# Patient Record
Sex: Female | Born: 1985 | Race: White | Hispanic: Yes | Marital: Married | Smoking: Never smoker
Health system: Southern US, Community
[De-identification: ages and names within clinical notes are randomized; demographics above are authoritative.]

## PROBLEM LIST (undated history)

## (undated) ENCOUNTER — Inpatient Hospital Stay (HOSPITAL_COMMUNITY): Payer: Self-pay

## (undated) DIAGNOSIS — R87629 Unspecified abnormal cytological findings in specimens from vagina: Secondary | ICD-10-CM

## (undated) DIAGNOSIS — F3181 Bipolar II disorder: Secondary | ICD-10-CM

## (undated) DIAGNOSIS — R519 Headache, unspecified: Secondary | ICD-10-CM

## (undated) DIAGNOSIS — D649 Anemia, unspecified: Secondary | ICD-10-CM

## (undated) DIAGNOSIS — F419 Anxiety disorder, unspecified: Secondary | ICD-10-CM

## (undated) DIAGNOSIS — G4700 Insomnia, unspecified: Secondary | ICD-10-CM

## (undated) DIAGNOSIS — F4310 Post-traumatic stress disorder, unspecified: Secondary | ICD-10-CM

## (undated) DIAGNOSIS — F319 Bipolar disorder, unspecified: Secondary | ICD-10-CM

## (undated) DIAGNOSIS — F329 Major depressive disorder, single episode, unspecified: Secondary | ICD-10-CM

## (undated) DIAGNOSIS — F32A Depression, unspecified: Secondary | ICD-10-CM

## (undated) HISTORY — DX: Bipolar II disorder: F31.81

## (undated) HISTORY — DX: Insomnia, unspecified: G47.00

## (undated) HISTORY — DX: Unspecified abnormal cytological findings in specimens from vagina: R87.629

## (undated) HISTORY — PX: NO PAST SURGERIES: SHX2092

## (undated) HISTORY — DX: Anemia, unspecified: D64.9

## (undated) HISTORY — DX: Post-traumatic stress disorder, unspecified: F43.10

## (undated) HISTORY — DX: Headache, unspecified: R51.9

## (undated) HISTORY — DX: Depression, unspecified: F32.A

## (undated) HISTORY — DX: Anxiety disorder, unspecified: F41.9

---
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
## 2013-08-10 ENCOUNTER — Ambulatory Visit: Payer: BC Managed Care – PPO | Admitting: Obstetrics & Gynecology

## 2013-12-17 ENCOUNTER — Telehealth: Payer: Self-pay

## 2013-12-17 ENCOUNTER — Encounter: Payer: Self-pay | Admitting: Obstetrics & Gynecology

## 2013-12-17 ENCOUNTER — Ambulatory Visit (INDEPENDENT_AMBULATORY_CARE_PROVIDER_SITE_OTHER): Payer: BC Managed Care – PPO | Admitting: Obstetrics & Gynecology

## 2013-12-17 VITALS — BP 134/86 | HR 84 | Temp 98.4°F | Ht 65.0 in | Wt 149.0 lb

## 2013-12-17 DIAGNOSIS — N939 Abnormal uterine and vaginal bleeding, unspecified: Secondary | ICD-10-CM

## 2013-12-17 DIAGNOSIS — L680 Hirsutism: Secondary | ICD-10-CM

## 2013-12-17 DIAGNOSIS — Z01419 Encounter for gynecological examination (general) (routine) without abnormal findings: Secondary | ICD-10-CM

## 2013-12-17 LAB — POCT URINE PREGNANCY: Preg Test, Ur: NEGATIVE

## 2013-12-17 NOTE — Telephone Encounter (Signed)
patient will call us to schedule ultrasound - she is checking with her insurance first to see what coverage would be

## 2013-12-17 NOTE — Patient Instructions (Signed)
Hirsutism and Masculinization  Hirsutism (increased body hair) is the growth of colored (pigmented) thick hair in women. It is most noticeable when it is on the moustache or beard areas. The other common sites are the:  · Chest.  · Abdomen.  · Thighs.  · Back.  Pubic hair growth may run upward from the usual bikini line to the middle of the abdomen.   Virilism (masculinization) is more extensive than hirsutism. It has extra symptoms. There may be:  · Acne.  · Oily skin.  · Baldness.  · Enlargement of the clitoris.  · Increased sex drive (libido).  · Voice deepening.  · Reduced breast size.  · Irregular or absent periods.  · Aggression.  The scalp hair growth may also bald in a typical female pattern.  CAUSES   This is caused by too much female sex hormone (androgen) in the body. It can be produced by the ovaries, adrenal glands, and within the skin. Hirsutism is most commonly related to polycystic ovarian syndrome (PCOS). The first signs of increased androgen levels are hirsutism and acne. How sensitive each person is to hormone levels varies greatly. Virilism may result from higher androgen levels. Some women with hirsutism have normal hormone levels. Eventually there may be female pattern balding. These problems are also connected to difficulty in having children (infertility). In addition, both malignant and benign tumors may cause hirsutism such as tumors of the adrenal gland (adenomas or adenocarcinomas) but this is a rare cause.  There is also evidence that insulin resistance may cause the androgynism. This problem is treated with some success with a medicine for diabetes (metformin).  Causes that come from outside the body (exogenous) may also lead to hirsutism such as intake of androgens by mouth.   Note: Women of Southwest Asian, Eastern European, and Southern European heritage commonly have facial, abdominal, and thigh hair that is normal for them.   TREATMENT   There are medical treatments that inhibit these  conditions, such as:  · Combined oral contraceptive pills, if you are not trying to become pregnant.  · Medicines that stop the production of hormones (gonadotropins).  · Steroids. This may be used if there is evidence of congenital (present since birth) adrenal hyperplasia (abnormal growth of cells).  · Metformin for the treatment of virilization, if sensitive to insulin.  · Suppression of ovarian hormone production with GnRH analogues (a hormone). They can only be used by themselves for short periods of time.  There are a variety of cosmetic treatments. These may be all that you need. They may be effective against occasional problems.  · Shaving is the simplest and most effective in the short term. Bleaching is not usually suitable for severe hirsutism.  · Plucking, waxing, sugaring (similar to waxing), and depilatory creams are effective. However, on occasion, they can result in skin irritation (inflammation) or infection.  · Electrolysis is effective.  Your caregiver can help you decide what needs to be done and what course of treatment will be best for you. Your caregiver may refer you to an endocrinologist. This is a physician who is specialized in the treatment of glandular disorders.  Document Released: 03/26/2001 Document Revised: 06/01/2013 Document Reviewed: 05/12/2008  ExitCare® Patient Information ©2015 [employer]. This information is not intended to replace advice given to you by your health care provider. Make sure you discuss any questions you have with your health care provider.

## 2013-12-17 NOTE — Progress Notes (Signed)
Subjective:     Jillian Holden is a 28 y.o. female here for a routine exam.     Personal health questionnaire:  Is patient Ashkenazi Jewish, have a family history of breast and/or ovarian cancer: no Is there a family history of uterine cancer diagnosed at age < 3650, gastrointestinal cancer, urinary tract cancer, family member who is a Personnel officerLynch syndrome-associated carrier: no Is the patient overweight and hypertensive, family history of diabetes, personal history of gestational diabetes or PCOS: no Is patient over 1355, have PCOS,  family history of premature CHD under age 26, diabetes, smoke, have hypertension or peripheral artery disease:  no At any time, has a partner hit, kicked or otherwise hurt or frightened you?: no Over the past 2 weeks, have you felt down, depressed or hopeless?: no Over the past 2 weeks, have you felt little interest or pleasure in doing things?:no   Gynecologic History Patient's last menstrual period was 12/06/2013. Contraception: condoms Last Pap: 3 yrs ago. Results were: normal   Obstetric History OB History  Gravida Para Term Preterm AB SAB TAB Ectopic Multiple Living  1    1 1         # Outcome Date GA Lbr Len/2nd Weight Sex Delivery Anes PTL Lv  1 SAB 2009     SAB         Past Medical History  Diagnosis Date  . Bipolar 2 disorder   . Insomnia   . Anxiety   . PTSD (post-traumatic stress disorder)     History reviewed. No pertinent past surgical history.  Current outpatient prescriptions: hydrOXYzine (VISTARIL) 25 MG capsule, Take 25 mg by mouth 3 (three) times daily as needed., Disp: , Rfl: ;  traZODone (DESYREL) 50 MG tablet, Take 50 mg by mouth at bedtime., Disp: , Rfl:  Allergies  Allergen Reactions  . Ambien [Zolpidem Tartrate] Other (See Comments)    Hallucination     History  Substance Use Topics  . Smoking status: Never Smoker   . Smokeless tobacco: Never Used  . Alcohol Use: 0.0 oz/week    0 Not specified per week     Comment:  Socially    History reviewed. No pertinent family history.    Review of Systems  Constitutional: negative for fatigue and weight loss Respiratory: negative for cough and wheezing Cardiovascular: negative for chest pain, fatigue and palpitations Gastrointestinal: negative for abdominal pain and change in bowel habits Musculoskeletal:negative for myalgias Neurological: negative for gait problems and tremors Behavioral/Psych: negative for abusive relationship, depression Endocrine: negative for temperature intolerance   Genitourinary: positive for abnormal menstrual periods; negative for genital lesions, hot flashes, sexual problems and vaginal discharge Integument/breast: negative for breast lump, breast tenderness, nipple discharge and positive for unwanted body hair    Objective:       BP 134/86 mmHg  Pulse 84  Temp(Src) 98.4 F (36.9 C)  Ht 5\' 5"  (1.651 m)  Wt 67.586 kg (149 lb)  BMI 24.79 kg/m2  LMP 12/06/2013 General:   alert  Skin:   no rash; hair on chest; female escutcheon, hair on buttocks  Lungs:   clear to auscultation bilaterally  Heart:   regular rate and rhythm, S1, S2 normal, no murmur, click, rub or gallop  Breasts:   normal without suspicious masses, skin or nipple changes or axillary nodes  Abdomen:  normal findings: no organomegaly, soft, non-tender and no hernia  Pelvis:  External genitalia: normal general appearance Urinary system: urethral meatus normal and bladder without fullness, nontender  Vaginal: normal without tenderness, induration or masses Cervix: normal appearance Adnexa: normal bimanual exam Uterus: anteverted and non-tender, normal size   Lab Review Urine pregnancy test Labs reviewed no Radiologic studies reviewed no    Assessment:    Healthy female exam.   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     Orders Placed This Encounter  Procedures  . US Pelvis Complete    Standing Status: Future     Number of Occurrences:      Standing Expiration Date: 02/17/2015    Order Specific Question:  Reason for Exam (SYMPTOM  OR DIAGNOSIS REQUIRED)    Answer:  Irregular cycles.    Order Specific Question:  Preferred imaging location?    Answer:  Internal  . TSH  . Prolactin  . Testosterone, free, total  . 17-Hydroxyprogesterone  . Progesterone  . DHEA-sulfate  . POCT urine pregnancy    Possible management options include: Skyla IUD Follow up as needed.

## 2013-12-18 ENCOUNTER — Encounter: Payer: Self-pay | Admitting: Obstetrics & Gynecology

## 2013-12-18 LAB — PAP IG W/ RFLX HPV ASCU

## 2013-12-18 LAB — TESTOSTERONE, FREE, TOTAL, SHBG
SEX HORMONE BINDING: 29 nmol/L (ref 18–114)
Testosterone, Free: 11.3 pg/mL — ABNORMAL HIGH (ref 0.6–6.8)
Testosterone-% Free: 1.9 % (ref 0.4–2.4)
Testosterone: 58 ng/dL (ref 10–70)

## 2013-12-18 LAB — PROLACTIN: Prolactin: 15.2 ng/mL

## 2013-12-18 LAB — DHEA-SULFATE: DHEA-SO4: 342 ug/dL (ref 18–391)

## 2013-12-18 LAB — TSH: TSH: 1.383 u[IU]/mL (ref 0.350–4.500)

## 2013-12-18 LAB — PROGESTERONE: PROGESTERONE: 0.7 ng/mL

## 2013-12-23 LAB — 17-HYDROXYPROGESTERONE: 17-OH-Progesterone, LC/MS/MS: 76 ng/dL

## 2014-01-25 ENCOUNTER — Encounter: Payer: Self-pay | Admitting: *Deleted

## 2014-01-26 ENCOUNTER — Encounter: Payer: Self-pay | Admitting: Obstetrics & Gynecology

## 2014-12-10 ENCOUNTER — Other Ambulatory Visit (INDEPENDENT_AMBULATORY_CARE_PROVIDER_SITE_OTHER): Payer: BLUE CROSS/BLUE SHIELD

## 2014-12-10 VITALS — BP 123/82 | HR 81 | Wt 145.0 lb

## 2014-12-10 DIAGNOSIS — Z3200 Encounter for pregnancy test, result unknown: Secondary | ICD-10-CM

## 2014-12-10 DIAGNOSIS — Z3201 Encounter for pregnancy test, result positive: Secondary | ICD-10-CM | POA: Diagnosis not present

## 2014-12-10 LAB — POCT URINE PREGNANCY: Preg Test, Ur: POSITIVE — AB

## 2014-12-10 NOTE — Progress Notes (Unsigned)
Pt is in office today for UPT.  Pt states that she has had Positive at home test.  UPT in office today is positive. Pt states that LMP 11-09-14.  Dating her at 4weeks 3days.  Pt was made aware of needs to seek emergent care at hospital as needed.  Pt advised that she may call office as needed for any question/concerns.  Pt states that she is currently taking a PNV. Pt is also having some nausea.   Pt was advised to stop taking PNV for a few days to see if that will help her nausea.    Pt has been made aware of scheduling timeframe and appt policy.  Pt is to be scheduled for NOB at check out today.

## 2015-01-18 ENCOUNTER — Encounter: Payer: Self-pay | Admitting: Certified Nurse Midwife

## 2015-01-18 ENCOUNTER — Ambulatory Visit (INDEPENDENT_AMBULATORY_CARE_PROVIDER_SITE_OTHER): Payer: BLUE CROSS/BLUE SHIELD | Admitting: Certified Nurse Midwife

## 2015-01-18 ENCOUNTER — Other Ambulatory Visit: Payer: Self-pay | Admitting: Certified Nurse Midwife

## 2015-01-18 VITALS — BP 126/77 | HR 87 | Wt 140.0 lb

## 2015-01-18 DIAGNOSIS — O219 Vomiting of pregnancy, unspecified: Secondary | ICD-10-CM | POA: Diagnosis not present

## 2015-01-18 DIAGNOSIS — Z3401 Encounter for supervision of normal first pregnancy, first trimester: Secondary | ICD-10-CM | POA: Insufficient documentation

## 2015-01-18 MED ORDER — PROMETHAZINE HCL 25 MG RE SUPP
25.0000 mg | Freq: Four times a day (QID) | RECTAL | Status: DC | PRN
Start: 2015-01-18 — End: 2015-02-15

## 2015-01-18 MED ORDER — METOCLOPRAMIDE HCL 10 MG PO TABS: 10.0000 mg | ORAL_TABLET | Freq: Three times a day (TID) | ORAL | Status: DC

## 2015-01-18 MED ORDER — PROMETHAZINE HCL 25 MG PO TABS: 25.0000 mg | ORAL_TABLET | Freq: Four times a day (QID) | ORAL | Status: DC | PRN

## 2015-01-18 NOTE — Progress Notes (Signed)
Pt is having N&V, not much appetite.

## 2015-01-18 NOTE — Progress Notes (Signed)
Subjective:    Jillian Holden is being seen today for her first obstetrical visit.  This is a planned pregnancy. She is at 799w0d gestation. Her obstetrical history is significant for none.  Not on medications for depression or bipolar disorder.  Relationship with FOB: spouse, living together. Patient does intend to breast feed. Pregnancy history fully reviewed.  Works full-time as an Print production planneroffice manager.    The information documented in the HPI was reviewed and verified.  Menstrual History: OB History    Gravida Para Term Preterm AB TAB SAB Ectopic Multiple Living   1    0  0         Menarche age: 29 years of age  Patient's last menstrual period was 11/09/2014.    Past Medical History  Diagnosis Date  . Bipolar 2 disorder (HCC)   . Insomnia   . Anxiety   . PTSD (post-traumatic stress disorder)     History reviewed. No pertinent past surgical history.   (Not in a hospital admission) Allergies  Allergen Reactions  . Ambien [Zolpidem Tartrate] Other (See Comments)    Hallucination     Social History  Substance Use Topics  . Smoking status: Never Smoker   . Smokeless tobacco: Never Used  . Alcohol Use: No     Comment: Socially    History reviewed. No pertinent family history.   Review of Systems Constitutional: negative for weight loss Gastrointestinal: + nausea and vomiting Genitourinary:negative for genital lesions and vaginal discharge and dysuria Musculoskeletal:negative for back pain Behavioral/Psych: negative for abusive relationship, depression, illegal drug usage and tobacco use    Objective:    BP 126/77 mmHg  Pulse 87  Wt 140 lb (63.504 kg)  LMP 11/09/2014 General Appearance:    Alert, cooperative, no distress, appears stated age  Head:    Normocephalic, without obvious abnormality, atraumatic  Eyes:    PERRL, conjunctiva/corneas clear, EOM's intact, fundi    benign, both eyes  Ears:    Normal TM's and external ear canals, both ears  Nose:   Nares normal,  septum midline, mucosa normal, no drainage    or sinus tenderness  Throat:   Lips, mucosa, and tongue normal; teeth and gums normal  Neck:   Supple, symmetrical, trachea midline, no adenopathy;    thyroid:  no enlargement/tenderness/nodules; no carotid   bruit or JVD  Back:     Symmetric, no curvature, ROM normal, no CVA tenderness  Lungs:     Clear to auscultation bilaterally, respirations unlabored  Chest Wall:    No tenderness or deformity   Heart:    Regular rate and rhythm, S1 and S2 normal, no murmur, rub   or gallop  Breast Exam:    No tenderness, masses, or nipple abnormality  Abdomen:     Soft, non-tender, bowel sounds active all four quadrants,    no masses, no organomegaly  Genitalia:    Normal female without lesion, discharge or tenderness  Extremities:   Extremities normal, atraumatic, no cyanosis or edema  Pulses:   2+ and symmetric all extremities  Skin:   Skin color, texture, turgor normal, no rashes or lesions  Lymph nodes:   Cervical, supraclavicular, and axillary nodes normal  Neurologic:   CNII-XII intact, normal strength, sensation and reflexes    throughout            Cervix:  Long, thick, closed and posterior  FHR: 160's, by doppler  Lab Review Urine pregnancy test Labs reviewed yes Radiologic studies reviewed no Assessment:    Pregnancy at [redacted]w[redacted]d weeks   N&V in early pregnancy  Hx of migraines  Hx of GERD  Plan:      Prenatal vitamins.  Counseling provided regarding continued use of seat belts, cessation of alcohol consumption, smoking or use of illicit drugs; infection precautions i.e., influenza/TDAP immunizations, toxoplasmosis,CMV, parvovirus, listeria and varicella; workplace safety, exercise during pregnancy; routine dental care, safe medications, sexual activity, hot tubs, saunas, pools, travel, caffeine use, fish and methlymercury, potential toxins, hair treatments, varicose veins Weight gain recommendations per  IOM guidelines reviewed: underweight/BMI< 18.5--> gain 28 - 40 lbs; normal weight/BMI 18.5 - 24.9--> gain 25 - 35 lbs; overweight/BMI 25 - 29.9--> gain 15 - 25 lbs; obese/BMI >30->gain  11 - 20 lbs Problem list reviewed and updated. FIRST/CF mutation testing/NIPT/QUAD SCREEN/fragile X/Ashkenazi Jewish population testing/Spinal muscular atrophy discussed: requested. Role of ultrasound in pregnancy discussed; fetal survey: requested. Amniocentesis discussed: not indicated. VBAC calculator score: VBAC consent form provided Meds ordered this encounter  Medications  . promethazine (PHENERGAN) 25 MG suppository    Sig: Place 1 suppository (25 mg total) rectally every 6 (six) hours as needed for nausea or vomiting.    Dispense:  12 each    Refill:  0  . promethazine (PHENERGAN) 25 MG tablet    Sig: Take 1 tablet (25 mg total) by mouth every 6 (six) hours as needed for nausea or vomiting.    Dispense:  30 tablet    Refill:  0  . metoCLOPramide (REGLAN) 10 MG tablet    Sig: Take 1 tablet (10 mg total) by mouth 4 (four) times daily -  before meals and at bedtime.    Dispense:  120 tablet    Refill:  5   Orders Placed This Encounter  Procedures  . Culture, OB Urine  . SureSwab, Vaginosis/Vaginitis Plus  . US OB Comp Less 14 Wks    Standing Status: Future     Number of Occurrences:      Standing Expiration Date: 03/20/2016    Order Specific Question:  Reason for Exam (SYMPTOM  OR DIAGNOSIS REQUIRED)    Answer:  dating and viability    Order Specific Question:  Preferred imaging location?    Answer:  Internal  . Obstetric panel  . HIV antibody  . Hemoglobinopathy evaluation  . Varicella zoster antibody, IgG  . VITAMIN D 25 Hydroxy (Vit-D Deficiency, Fractures)  . TSH  . POCT urinalysis dipstick    Follow up in 4 weeks. 50% of 30 min visit spent on counseling and coordination of care.

## 2015-01-19 LAB — VARICELLA ZOSTER ANTIBODY, IGG: VARICELLA IGG: 559.8 {index} — AB (ref <135.00)

## 2015-01-19 LAB — OBSTETRIC PANEL
ANTIBODY SCREEN: NEGATIVE
BASOS PCT: 0 % (ref 0–1)
Basophils Absolute: 0 10*3/uL (ref 0.0–0.1)
EOS ABS: 0.3 10*3/uL (ref 0.0–0.7)
EOS PCT: 3 % (ref 0–5)
HCT: 36.4 % (ref 36.0–46.0)
Hemoglobin: 12.8 g/dL (ref 12.0–15.0)
Hepatitis B Surface Ag: NEGATIVE
LYMPHS PCT: 20 % (ref 12–46)
Lymphs Abs: 1.7 10*3/uL (ref 0.7–4.0)
MCH: 32.2 pg (ref 26.0–34.0)
MCHC: 35.2 g/dL (ref 30.0–36.0)
MCV: 91.7 fL (ref 78.0–100.0)
MONO ABS: 0.7 10*3/uL (ref 0.1–1.0)
MPV: 11.2 fL (ref 8.6–12.4)
Monocytes Relative: 8 % (ref 3–12)
Neutro Abs: 6 10*3/uL (ref 1.7–7.7)
Neutrophils Relative %: 69 % (ref 43–77)
PLATELETS: 192 10*3/uL (ref 150–400)
RBC: 3.97 MIL/uL (ref 3.87–5.11)
RDW: 13.3 % (ref 11.5–15.5)
RH TYPE: POSITIVE
Rubella: 2.11 Index — ABNORMAL HIGH (ref <0.90)
WBC: 8.7 10*3/uL (ref 4.0–10.5)

## 2015-01-19 LAB — VITAMIN D 25 HYDROXY (VIT D DEFICIENCY, FRACTURES): Vit D, 25-Hydroxy: 26 ng/mL — ABNORMAL LOW (ref 30–100)

## 2015-01-19 LAB — CULTURE, OB URINE

## 2015-01-19 LAB — HIV ANTIBODY (ROUTINE TESTING W REFLEX): HIV 1&2 Ab, 4th Generation: NONREACTIVE

## 2015-01-19 LAB — TSH: TSH: 0.555 u[IU]/mL (ref 0.350–4.500)

## 2015-01-20 ENCOUNTER — Other Ambulatory Visit: Payer: Self-pay | Admitting: Certified Nurse Midwife

## 2015-01-20 LAB — PAP IG W/ RFLX HPV ASCU

## 2015-01-20 LAB — HEMOGLOBINOPATHY EVALUATION
HGB A2 QUANT: 2.7 % (ref 2.2–3.2)
HGB F QUANT: 0 % (ref 0.0–2.0)
Hemoglobin Other: 0 %
Hgb A: 97.3 % (ref 96.8–97.8)
Hgb S Quant: 0 %

## 2015-01-22 LAB — SURESWAB, VAGINOSIS/VAGINITIS PLUS
ATOPOBIUM VAGINAE: NOT DETECTED Log (cells/mL)
BV CATEGORY: UNDETERMINED — AB
C. ALBICANS, DNA: NOT DETECTED
C. PARAPSILOSIS, DNA: NOT DETECTED
C. TRACHOMATIS RNA, TMA: NOT DETECTED
C. glabrata, DNA: NOT DETECTED
C. tropicalis, DNA: NOT DETECTED
Gardnerella vaginalis: 6.2 Log (cells/mL)
LACTOBACILLUS SPECIES: 7.8 Log (cells/mL)
MEGASPHAERA SPECIES: NOT DETECTED Log (cells/mL)
N. GONORRHOEAE RNA, TMA: NOT DETECTED
T. vaginalis RNA, QL TMA: NOT DETECTED

## 2015-01-26 ENCOUNTER — Other Ambulatory Visit: Payer: Self-pay | Admitting: Certified Nurse Midwife

## 2015-01-26 DIAGNOSIS — B9689 Other specified bacterial agents as the cause of diseases classified elsewhere: Secondary | ICD-10-CM

## 2015-01-26 DIAGNOSIS — N760 Acute vaginitis: Principal | ICD-10-CM

## 2015-01-26 MED ORDER — METRONIDAZOLE 0.75 % VA GEL: 1.0000 | Freq: Two times a day (BID) | VAGINAL | Status: DC

## 2015-01-27 ENCOUNTER — Other Ambulatory Visit: Payer: Self-pay | Admitting: *Deleted

## 2015-01-27 ENCOUNTER — Other Ambulatory Visit: Payer: Self-pay | Admitting: Certified Nurse Midwife

## 2015-01-27 DIAGNOSIS — O219 Vomiting of pregnancy, unspecified: Secondary | ICD-10-CM

## 2015-01-27 DIAGNOSIS — N760 Acute vaginitis: Principal | ICD-10-CM

## 2015-01-27 DIAGNOSIS — B9689 Other specified bacterial agents as the cause of diseases classified elsewhere: Secondary | ICD-10-CM

## 2015-01-27 LAB — HUMAN PAPILLOMAVIRUS, HIGH RISK: HPV DNA HIGH RISK: DETECTED — AB

## 2015-01-27 MED ORDER — METRONIDAZOLE 0.75 % VA GEL: 1.0000 | Freq: Two times a day (BID) | VAGINAL | Status: DC

## 2015-01-27 MED ORDER — PROMETHAZINE HCL 25 MG PO TABS
25.0000 mg | ORAL_TABLET | Freq: Four times a day (QID) | ORAL | Status: DC | PRN
Start: 2015-01-27 — End: 2015-05-12

## 2015-01-27 MED ORDER — TRAZODONE HCL 50 MG PO TABS: 50.0000 mg | ORAL_TABLET | Freq: Every day | ORAL | Status: DC

## 2015-01-30 LAB — HPV TYPE 16/18
HPV Genotype, 16: NOT DETECTED
HPV Genotype, 18: NOT DETECTED

## 2015-01-30 NOTE — L&D Delivery Note (Signed)
Delivery Note This is a 30 year old G 1 P0 who was admitted for Early latent labor.. She progressed normally with Nitrous oxide and 4U of pitocin to the second stage of labor.  She pushed for 45 min.  At 12:47 AM she delivered a viable infant female, cephalic.  A nuchal cord   was identified with a body cord, somersaulted through. Infant placed on maternal abdomen.  Delayed cord clamping was performed for 7-10 minutes.  Cord double clamped and cut.  Apgar scores were 8 and 9. The placenta delivered spontaneously, shultz, with a 3 vessel cord.  Inspection revealed 2nd degree. The uterus was firm bleeding stable.  The repair was done under local lidocaine injection.   EBL was 400.    Placenta and umbilical artery blood gas were not sent.  There were no complications during the procedure.  Mom and baby skin to skin following delivery. Left in stable condition.   a viable female was delivered via Vaginal, Spontaneous Delivery (Presentation: Left Occiput Anterior).  APGAR: 8, 9; weight  .   Placenta status: Intact, Spontaneous.  Cord: 3 vessels with the following complications: None.  Cord pH: N/A  Anesthesia: Other  Episiotomy: None Lacerations: 2nd degree Suture Repair: 3.0 vicryl Est. Blood Loss (mL): 400  Mom to postpartum.  Baby to Couplet care / Skin to Skin.  Roe Coombsachelle A Journey [place], CNM 08/11/2015, 1:36 AM

## 2015-02-03 ENCOUNTER — Ambulatory Visit (INDEPENDENT_AMBULATORY_CARE_PROVIDER_SITE_OTHER): Payer: BLUE CROSS/BLUE SHIELD

## 2015-02-03 DIAGNOSIS — O3680X1 Pregnancy with inconclusive fetal viability, fetus 1: Secondary | ICD-10-CM | POA: Diagnosis not present

## 2015-02-03 DIAGNOSIS — Z3401 Encounter for supervision of normal first pregnancy, first trimester: Secondary | ICD-10-CM

## 2015-02-15 ENCOUNTER — Ambulatory Visit (INDEPENDENT_AMBULATORY_CARE_PROVIDER_SITE_OTHER): Payer: BLUE CROSS/BLUE SHIELD | Admitting: Certified Nurse Midwife

## 2015-02-15 VITALS — BP 109/69 | HR 87 | Temp 98.2°F | Wt 148.0 lb

## 2015-02-15 DIAGNOSIS — Z3402 Encounter for supervision of normal first pregnancy, second trimester: Secondary | ICD-10-CM

## 2015-02-15 DIAGNOSIS — J069 Acute upper respiratory infection, unspecified: Secondary | ICD-10-CM

## 2015-02-15 DIAGNOSIS — O219 Vomiting of pregnancy, unspecified: Secondary | ICD-10-CM

## 2015-02-15 LAB — POCT URINALYSIS DIPSTICK
BILIRUBIN UA: NEGATIVE
Glucose, UA: NEGATIVE
Ketones, UA: NEGATIVE
Leukocytes, UA: NEGATIVE
NITRITE UA: NEGATIVE
PH UA: 7
Protein, UA: NEGATIVE
RBC UA: NEGATIVE
Spec Grav, UA: <=1.005
UROBILINOGEN UA: NEGATIVE

## 2015-02-15 MED ORDER — GUAIFENESIN ER 600 MG PO TB12: 1200.0000 mg | ORAL_TABLET | Freq: Two times a day (BID) | ORAL | Status: DC

## 2015-02-15 MED ORDER — PSEUDOEPHEDRINE HCL ER 120 MG PO TB12
120.0000 mg | ORAL_TABLET | Freq: Two times a day (BID) | ORAL | Status: DC
Start: 2015-02-15 — End: 2015-05-12

## 2015-02-15 MED ORDER — ONDANSETRON HCL 4 MG PO TABS: 4.0000 mg | ORAL_TABLET | Freq: Every day | ORAL | Status: DC | PRN

## 2015-02-15 MED ORDER — DIPHENHYDRAMINE HCL 25 MG PO TABS: 25.0000 mg | ORAL_TABLET | ORAL | Status: DC | PRN

## 2015-02-15 MED ORDER — FLUTICASONE PROPIONATE 50 MCG/ACT NA SUSP: 2.0000 | Freq: Every day | NASAL | Status: DC

## 2015-02-15 NOTE — Progress Notes (Signed)
  Subjective:    Jillian Holden is a 30 y.o. female being seen today for her obstetrical visit. She is at [redacted]w[redacted]d gestation. Patient reports: fatigue, headache, nausea, no bleeding, no contractions, no cramping, no leaking, vomiting and URI symptoms, denies fever and has not tried anything for the nasal drainage, denies coughing.  Problem List Items Addressed This Visit    None    Visit Diagnoses    Encounter for supervision of normal first pregnancy in second trimester    -  Primary    Relevant Medications    ondansetron (ZOFRAN) 4 MG tablet    fluticasone (FLONASE) 50 MCG/ACT nasal spray    diphenhydrAMINE (BENADRYL) 25 MG tablet    guaiFENesin (MUCINEX) 600 MG 12 hr tablet    pseudoephedrine (SUDAFED 12 HOUR) 120 MG 12 hr tablet    Other Relevant Orders    POCT urinalysis dipstick (Completed)    US OB Comp + 14 Wk    AFP, Quad Screen    Nausea and vomiting in pregnancy prior to [redacted] weeks gestation        Relevant Medications    ondansetron (ZOFRAN) 4 MG tablet    fluticasone (FLONASE) 50 MCG/ACT nasal spray    diphenhydrAMINE (BENADRYL) 25 MG tablet    guaiFENesin (MUCINEX) 600 MG 12 hr tablet    pseudoephedrine (SUDAFED 12 HOUR) 120 MG 12 hr tablet    URI, acute        Relevant Medications    fluticasone (FLONASE) 50 MCG/ACT nasal spray    diphenhydrAMINE (BENADRYL) 25 MG tablet    guaiFENesin (MUCINEX) 600 MG 12 hr tablet    pseudoephedrine (SUDAFED 12 HOUR) 120 MG 12 hr tablet      Patient Active Problem List   Diagnosis Date Noted  . Supervision of normal first pregnancy in first trimester 01/18/2015    Objective:     BP 109/69 mmHg  Pulse 87  Temp(Src) 98.2 F (36.8 C)  Wt 148 lb (67.132 kg)  LMP 11/09/2014 Uterine Size: Below umbilicus   FHR: seen with bedside US  HEENT:  Clear nasal drainage present, bilateral septum erythema, sinuses: non-tender  Assessment:    Pregnancy @ [redacted]w[redacted]d  weeks URI acute    N&V in early pregnancy Plan:    Problem list  reviewed and updated. Labs reviewed.  Follow up in 4 weeks. FIRST/CF mutation testing/NIPT/QUAD SCREEN/fragile X/Ashkenazi Jewish population testing/Spinal muscular atrophy discussed: ordered. Role of ultrasound in pregnancy discussed; fetal survey: ordered. Amniocentesis discussed: not indicated. 50% of 15 minute visit spent on counseling and coordination of care.

## 2015-02-17 LAB — AFP, QUAD SCREEN
AFP: 12.9 ng/mL
Age Alone: 1:874 {titer}
Curr Gest Age: 14 wks.days
Down Syndrome Scr Risk Est: 1:138 {titer}
HCG, Total: 89.83 IU/mL
INH: 328.9 pg/mL
Interpretation-AFP: POSITIVE — AB
MOM FOR HCG: 1.27
MoM for AFP: 0.53
MoM for INH: 1.61
OPEN SPINA BIFIDA: NEGATIVE
TRI 18 SCR RISK EST: NEGATIVE
UE3 VALUE: 0.39 ng/mL
uE3 Mom: 0.85

## 2015-02-18 ENCOUNTER — Telehealth: Payer: Self-pay

## 2015-02-18 ENCOUNTER — Other Ambulatory Visit: Payer: Self-pay | Admitting: Certified Nurse Midwife

## 2015-02-18 DIAGNOSIS — R772 Abnormality of alphafetoprotein: Secondary | ICD-10-CM

## 2015-02-18 NOTE — Telephone Encounter (Signed)
spoke with patient - u/s is at [facility] on 03/16/15 - keeping same appt to see Rachelle on 03/17/15

## 2015-02-23 ENCOUNTER — Other Ambulatory Visit: Payer: Self-pay | Admitting: Certified Nurse Midwife

## 2015-03-01 ENCOUNTER — Other Ambulatory Visit: Payer: Self-pay | Admitting: Certified Nurse Midwife

## 2015-03-02 ENCOUNTER — Telehealth: Payer: Self-pay | Admitting: *Deleted

## 2015-03-02 NOTE — Telephone Encounter (Signed)
Patient contacted the office stating her nausea medication is not working. Attempted to contact the patient and left message for patient to call the office.

## 2015-03-06 ENCOUNTER — Encounter (HOSPITAL_COMMUNITY): Payer: Self-pay | Admitting: *Deleted

## 2015-03-06 ENCOUNTER — Inpatient Hospital Stay (HOSPITAL_COMMUNITY)
Admission: AD | Admit: 2015-03-06 | Discharge: 2015-03-06 | Disposition: A | Discharge status: Home / Self Care | Payer: BLUE CROSS/BLUE SHIELD | Source: Ambulatory Visit | Attending: Obstetrics | Admitting: Obstetrics

## 2015-03-06 DIAGNOSIS — O210 Mild hyperemesis gravidarum: Secondary | ICD-10-CM | POA: Diagnosis not present

## 2015-03-06 DIAGNOSIS — R51 Headache: Secondary | ICD-10-CM | POA: Diagnosis not present

## 2015-03-06 DIAGNOSIS — O26892 Other specified pregnancy related conditions, second trimester: Secondary | ICD-10-CM | POA: Diagnosis not present

## 2015-03-06 DIAGNOSIS — Z3A16 16 weeks gestation of pregnancy: Secondary | ICD-10-CM | POA: Diagnosis not present

## 2015-03-06 DIAGNOSIS — K5900 Constipation, unspecified: Secondary | ICD-10-CM

## 2015-03-06 DIAGNOSIS — R519 Headache, unspecified: Secondary | ICD-10-CM

## 2015-03-06 DIAGNOSIS — O219 Vomiting of pregnancy, unspecified: Secondary | ICD-10-CM

## 2015-03-06 LAB — CBC
HCT: 32.8 % — ABNORMAL LOW (ref 36.0–46.0)
HEMOGLOBIN: 11.1 g/dL — AB (ref 12.0–15.0)
MCH: 31 pg (ref 26.0–34.0)
MCHC: 33.8 g/dL (ref 30.0–36.0)
MCV: 91.6 fL (ref 78.0–100.0)
Platelets: 247 10*3/uL (ref 150–400)
RBC: 3.58 MIL/uL — ABNORMAL LOW (ref 3.87–5.11)
RDW: 13.2 % (ref 11.5–15.5)
WBC: 8.2 10*3/uL (ref 4.0–10.5)

## 2015-03-06 LAB — COMPREHENSIVE METABOLIC PANEL
ALK PHOS: 45 U/L (ref 38–126)
ALT: 16 U/L (ref 14–54)
ANION GAP: 8 (ref 5–15)
AST: 18 U/L (ref 15–41)
Albumin: 3.4 g/dL — ABNORMAL LOW (ref 3.5–5.0)
BILIRUBIN TOTAL: 0.5 mg/dL (ref 0.3–1.2)
BUN: 7 mg/dL (ref 6–20)
CALCIUM: 8.5 mg/dL — AB (ref 8.9–10.3)
CO2: 23 mmol/L (ref 22–32)
CREATININE: 0.48 mg/dL (ref 0.44–1.00)
Chloride: 102 mmol/L (ref 101–111)
Glucose, Bld: 103 mg/dL — ABNORMAL HIGH (ref 65–99)
Potassium: 3.5 mmol/L (ref 3.5–5.1)
Sodium: 133 mmol/L — ABNORMAL LOW (ref 135–145)
TOTAL PROTEIN: 6.8 g/dL (ref 6.5–8.1)

## 2015-03-06 LAB — URINALYSIS, ROUTINE W REFLEX MICROSCOPIC
BILIRUBIN URINE: NEGATIVE
GLUCOSE, UA: NEGATIVE mg/dL
Hgb urine dipstick: NEGATIVE
KETONES UR: NEGATIVE mg/dL
LEUKOCYTES UA: NEGATIVE
Nitrite: NEGATIVE
PROTEIN: NEGATIVE mg/dL
Specific Gravity, Urine: 1.02 (ref 1.005–1.030)
pH: 5.5 (ref 5.0–8.0)

## 2015-03-06 MED ORDER — METOCLOPRAMIDE HCL 5 MG/ML IJ SOLN
10.0000 mg | Freq: Once | INTRAMUSCULAR | Status: AC
  Administered 2015-03-06: 10 mg via INTRAVENOUS
  Filled 2015-03-06: qty 2

## 2015-03-06 MED ORDER — DEXTROSE 5 % IN LACTATED RINGERS IV BOLUS
1000.0000 mL | Freq: Once | INTRAVENOUS | Status: AC
  Administered 2015-03-06: 1000 mL via INTRAVENOUS

## 2015-03-06 MED ORDER — DEXAMETHASONE SODIUM PHOSPHATE 10 MG/ML IJ SOLN
10.0000 mg | Freq: Once | INTRAMUSCULAR | Status: AC
  Administered 2015-03-06: 10 mg via INTRAVENOUS
  Filled 2015-03-06: qty 1

## 2015-03-06 MED ORDER — DIPHENHYDRAMINE HCL 50 MG/ML IJ SOLN
25.0000 mg | Freq: Once | INTRAMUSCULAR | Status: AC
  Administered 2015-03-06: 25 mg via INTRAVENOUS
  Filled 2015-03-06: qty 1

## 2015-03-06 NOTE — MAU Note (Signed)
Severe headache on and off since Friday evening, took Tylenol did not help, dizziness, nausea more than usual.

## 2015-03-06 NOTE — Discharge Instructions (Signed)

## 2015-03-06 NOTE — MAU Provider Note (Signed)
History     CSN: [id number]  Arrival date and time: 03/06/15 1646   First Provider Initiated Contact with Patient 03/06/15 1708      Chief Complaint  Patient presents with  . Headache  . Dizziness   HPI Pt is [redacted]w[redacted]d pregnant G1P0000 presents with nausea, headache and dizziness Pt denies spotting or bleeding. Pt's headache is located frontal- denies blurred vision, or photophobia. Pt has also been nauseated.  Pt took Tylenol for her headache but did not relieve Pt had dizziness when she got up. Pt has been taking Reglan with help for her nausea- which she says is the only thing that helps. RN note: Severe headache on and off since Friday evening, took Tylenol did not help, dizziness, nausea more than usual.  Past Medical History  Diagnosis Date  . Bipolar 2 disorder (HCC)   . Insomnia   . Anxiety   . PTSD (post-traumatic stress disorder)     No past surgical history on file.  No family history on file.  Social History  Substance Use Topics  . Smoking status: Never Smoker   . Smokeless tobacco: Never Used  . Alcohol Use: No     Comment: Socially    Allergies:  Allergies  Allergen Reactions  . Ambien [Zolpidem Tartrate] Other (See Comments)    Hallucination     Prescriptions prior to admission  Medication Sig Dispense Refill Last Dose  . diphenhydrAMINE (BENADRYL) 25 MG tablet Take 1 tablet (25 mg total) by mouth every 4 (four) hours as needed for allergies or sleep. 200 tablet 3   . fluticasone (FLONASE) 50 MCG/ACT nasal spray Place 2 sprays into both nostrils daily. 16 g 2   . guaiFENesin (MUCINEX) 600 MG 12 hr tablet Take 2 tablets (1,200 mg total) by mouth 2 (two) times daily. 120 tablet 0   . hydrOXYzine (VISTARIL) 25 MG capsule Take 25 mg by mouth 3 (three) times daily as needed. Reported on 02/15/2015   Not Taking  . metoCLOPramide (REGLAN) 10 MG tablet Take 1 tablet (10 mg total) by mouth 4 (four) times daily -  before meals and at bedtime. 120 tablet 5  Taking  . metroNIDAZOLE (METROGEL VAGINAL) 0.75 % vaginal gel Place 1 Applicatorful vaginally 2 (two) times daily. 70 g 0 Taking  . ondansetron (ZOFRAN) 4 MG tablet Take 1 tablet (4 mg total) by mouth daily as needed. 30 tablet 1   . promethazine (PHENERGAN) 25 MG tablet Take 1 tablet (25 mg total) by mouth every 6 (six) hours as needed for nausea or vomiting. 30 tablet 4 Taking  . pseudoephedrine (SUDAFED 12 HOUR) 120 MG 12 hr tablet Take 1 tablet (120 mg total) by mouth 2 (two) times daily. 60 tablet 1     Review of Systems  Constitutional: Negative for fever and chills.  HENT:       Frontal headache 5/10 pain now- has been 8/10  Eyes: Negative for blurred vision and double vision.  Gastrointestinal: Positive for nausea, vomiting and constipation. Negative for abdominal pain.  Genitourinary: Negative for dysuria and urgency.  Neurological: Positive for dizziness and headaches.   Physical Exam   Blood pressure 109/63, pulse 81, temperature 98.9 F (37.2 C), temperature source Oral, resp. rate 18, height 5\' 5"  (1.651 m), weight 147 lb (66.679 kg), last menstrual period 11/09/2014, SpO2 100 %.  Physical Exam  Nursing note and vitals reviewed. Constitutional: She is oriented to person, place, and time. She appears well-developed and well-nourished. No distress.  HENT:  Head: Normocephalic.  Eyes: Pupils are equal, round, and reactive to light.  Neck: Normal range of motion. Neck supple.  Cardiovascular: Normal rate.   Respiratory: Effort normal.  GI: Soft. She exhibits no distension. There is no tenderness. There is no rebound and no guarding.  FHR with doppler 156 bpm  Musculoskeletal: Normal range of motion.  Neurological: She is alert and oriented to person, place, and time.  Skin: Skin is warm and dry.  Psychiatric: She has a normal mood and affect.    MAU Course  Procedures Results for orders placed or performed during the hospital encounter of 03/06/15 (from the past 24  hour(s))  Urinalysis, Routine w reflex microscopic (not at [facility])     Status: None   Collection Time: 03/06/15  5:00 PM  Result Value Ref Range   Color, Urine YELLOW YELLOW   APPearance CLEAR CLEAR   Specific Gravity, Urine 1.020 1.005 - 1.030   pH 5.5 5.0 - 8.0   Glucose, UA NEGATIVE NEGATIVE mg/dL   Hgb urine dipstick NEGATIVE NEGATIVE   Bilirubin Urine NEGATIVE NEGATIVE   Ketones, ur NEGATIVE NEGATIVE mg/dL   Protein, ur NEGATIVE NEGATIVE mg/dL   Nitrite NEGATIVE NEGATIVE   Leukocytes, UA NEGATIVE NEGATIVE  CBC     Status: Abnormal   Collection Time: 03/06/15  5:14 PM  Result Value Ref Range   WBC 8.2 4.0 - 10.5 K/uL   RBC 3.58 (L) 3.87 - 5.11 MIL/uL   Hemoglobin 11.1 (L) 12.0 - 15.0 g/dL   HCT 40.9 (L) 81.1 - 91.4 %   MCV 91.6 78.0 - 100.0 fL   MCH 31.0 26.0 - 34.0 pg   MCHC 33.8 30.0 - 36.0 g/dL   RDW 78.2 95.6 - 21.3 %   Platelets 247 150 - 400 K/uL  Comprehensive metabolic panel     Status: Abnormal   Collection Time: 03/06/15  5:14 PM  Result Value Ref Range   Sodium 133 (L) 135 - 145 mmol/L   Potassium 3.5 3.5 - 5.1 mmol/L   Chloride 102 101 - 111 mmol/L   CO2 23 22 - 32 mmol/L   Glucose, Bld 103 (H) 65 - 99 mg/dL   BUN 7 6 - 20 mg/dL   Creatinine, Ser 0.86 0.44 - 1.00 mg/dL   Calcium 8.5 (L) 8.9 - 10.3 mg/dL   Total Protein 6.8 6.5 - 8.1 g/dL   Albumin 3.4 (L) 3.5 - 5.0 g/dL   AST 18 15 - 41 U/L   ALT 16 14 - 54 U/L   Alkaline Phosphatase 45 38 - 126 U/L   Total Bilirubin 0.5 0.3 - 1.2 mg/dL   GFR calc non Af Amer >60 >60 mL/min   GFR calc Af Amer >60 >60 mL/min   Anion gap 8 5 - 15  Migraine protocol given to pt D5LR with Benadryl , Decadron  and Reglan  Headache greatly improved to 2/10, nausea and cramping gone   Assessment and Plan  Headache in Pregnancy- resolved with IV fluids and Migraine cocktail Nausea and vomiting in pregnancy- continue Reglan as prescribed Constipation- stool softener; Miralax F/u with Dr. Clearance Coots for scheduled OB  appointment; sooner if increase in sx. Viable IUP [redacted]w[redacted]d  Jillian Holden 03/06/2015, 5:16 PM

## 2015-03-16 ENCOUNTER — Ambulatory Visit (HOSPITAL_COMMUNITY)
Admission: RE | Admit: 2015-03-16 | Discharge: 2015-03-16 | Disposition: A | Discharge status: Home / Self Care | Payer: BLUE CROSS/BLUE SHIELD | Source: Ambulatory Visit | Attending: Certified Nurse Midwife | Admitting: Certified Nurse Midwife

## 2015-03-16 ENCOUNTER — Encounter (HOSPITAL_COMMUNITY): Payer: Self-pay

## 2015-03-16 ENCOUNTER — Other Ambulatory Visit: Payer: Self-pay | Admitting: Certified Nurse Midwife

## 2015-03-16 DIAGNOSIS — Z3689 Encounter for other specified antenatal screening: Secondary | ICD-10-CM

## 2015-03-16 DIAGNOSIS — Z3A18 18 weeks gestation of pregnancy: Secondary | ICD-10-CM | POA: Diagnosis not present

## 2015-03-16 DIAGNOSIS — Z315 Encounter for genetic counseling: Secondary | ICD-10-CM | POA: Diagnosis present

## 2015-03-16 DIAGNOSIS — O283 Abnormal ultrasonic finding on antenatal screening of mother: Secondary | ICD-10-CM | POA: Diagnosis not present

## 2015-03-16 DIAGNOSIS — R772 Abnormality of alphafetoprotein: Secondary | ICD-10-CM

## 2015-03-16 DIAGNOSIS — O289 Unspecified abnormal findings on antenatal screening of mother: Secondary | ICD-10-CM | POA: Insufficient documentation

## 2015-03-16 DIAGNOSIS — O280 Abnormal hematological finding on antenatal screening of mother: Secondary | ICD-10-CM

## 2015-03-16 NOTE — Progress Notes (Signed)
Genetic Counseling  DOB: 1985/03/01 Referring Provider: Roe Coombs, CNM Appointment Date: 03/16/2015 Attending: Dr. Particia Nearing  [name] and her husband, [name], were seen for genetic counseling because of an increased risk for fetal Down syndrome based on a maternal serum Quad screen.  In summary:  Discussed abnormal Quad screen result  Increased risk for Down syndrome  Reduction in risk for Trisomy 18 and open neural tube defects  Discussed options for additional screening and/or diagnostic testing  Declined NIPS  Declined amniocentesis  History of bipolar disorder for patient  Reviewed multifactorial inheritance of mental health disorders  Testing not currently available prenatally  They were counseled regarding the Quad screen result and the associated 1 in 137 risk for fetal Down syndrome.  We reviewed chromosomes, nondisjunction, and the common features and variable prognosis of Down syndrome.  In addition, we reviewed the screen adjusted reduction in risks for trisomy 18 and ONTDs.  We also discussed other explanations for a screen positive result including: a gestational dating error, differences in maternal metabolism, and normal variation. They understand that this screening is not diagnostic for Down syndrome but provides a risk assessment.  We reviewed available screening options including noninvasive prenatal screening (NIPS)/cell free DNA (cfDNA) testing, and detailed ultrasound.  They were counseled that screening tests are used to modify a patient's a priori risk for aneuploidy, typically based on age. This estimate provides a pregnancy specific risk assessment. We reviewed the benefits and limitations of each option. Specifically, we discussed the conditions for which each test screens, the detection rates, and false positive rates of each. They were also counseled regarding diagnostic testing via amniocentesis. We reviewed the  approximate 1 in 300-500 risk for complications for amniocentesis, including spontaneous pregnancy loss. A complete ultrasound was performed today. The ultrasound report will be sent under separate cover. In brief, there were no visualized fetal anomalies or markers suggestive of aneuploidy.  We discussed the likely reduction in risk for fetal Down syndrome in the presence of a normal ultrasound.    Additional screening and diagnostic testing were declined today.  They understand that screening tests, including Quad screen and ultrasound, cannot rule out all birth defects or genetic conditions. The patient was advised of this limitation and states she still does not want additional testing or screening at this time.   Jillian Holden was provided with written information regarding cystic fibrosis (CF) including the carrier frequency and incidence in the Caucasian population, the availability of carrier testing and prenatal diagnosis if indicated.  In addition, we discussed that CF is routinely screened for as part of the [place] newborn screening panel.  She declined testing today.   Both family histories were reviewed and found to be contributory for bipolar disorder in Jillian Holden.  We discussed that for the majority of cases of mental health conditions, an underlying genetic cause is not known but a combination of genetic and environmental factors (multifactorial inheritance) are suspected to contribute to their onset.  Recurrence risk for first degree relatives is estimated to be approximately 10-25%, in the case of multifactorial inheritance observed. In some families, mental health conditions may even be dominant, meaning that when one parent has the condition each child could have up to a 50% risk to inherit the condition. We discussed that it might be helpful for pediatricians to be aware of this family history to ensure that family members are followed appropriately. The patient understands that  prenatal testing or screening is  not available for the majority of mental health conditions.   Without further information regarding the provided family history, an accurate genetic risk cannot be calculated. Further genetic counseling is warranted if more information is obtained.  Jillian Holden denied exposure to environmental toxins or chemical agents. She denied the use of alcohol, tobacco or street drugs. She denied significant viral illnesses during the course of her pregnancy. Her medical and surgical histories were noncontributory.   I counseled this couple for approximately 40 minutes regarding the above risks and available options. Most of the counseling was provided by Cornelia Copa, [employer] genetic counseling student, under my direct supervision.   Jillian Gemma, MS,  Certified Genetic Counselor

## 2015-03-17 ENCOUNTER — Ambulatory Visit (INDEPENDENT_AMBULATORY_CARE_PROVIDER_SITE_OTHER): Payer: BLUE CROSS/BLUE SHIELD | Admitting: Certified Nurse Midwife

## 2015-03-17 ENCOUNTER — Other Ambulatory Visit: Payer: BLUE CROSS/BLUE SHIELD

## 2015-03-17 VITALS — BP 106/65 | HR 84 | Temp 98.6°F | Wt 146.0 lb

## 2015-03-17 DIAGNOSIS — Z3402 Encounter for supervision of normal first pregnancy, second trimester: Secondary | ICD-10-CM

## 2015-03-17 DIAGNOSIS — R51 Headache: Secondary | ICD-10-CM

## 2015-03-17 DIAGNOSIS — O26892 Other specified pregnancy related conditions, second trimester: Secondary | ICD-10-CM

## 2015-03-17 LAB — POCT URINALYSIS DIPSTICK
Bilirubin, UA: NEGATIVE
Blood, UA: NEGATIVE
GLUCOSE UA: NEGATIVE
KETONES UA: NEGATIVE
Nitrite, UA: NEGATIVE
PROTEIN UA: NEGATIVE
SPEC GRAV UA: 1.01
UROBILINOGEN UA: NEGATIVE
pH, UA: 7

## 2015-03-17 MED ORDER — BUTALBITAL-APAP-CAFFEINE 50-325-40 MG PO TABS: 1.0000 | ORAL_TABLET | Freq: Four times a day (QID) | ORAL | Status: DC | PRN

## 2015-03-17 NOTE — Progress Notes (Signed)
Subjective:    Jillian Holden is a 29 y.o. female being seen today for her obstetrical visit. She is at [redacted]w[redacted]d gestation. Patient reports: headache, no bleeding, no contractions, no cramping and no leaking . Fetal movement: normal.  Problem List Items Addressed This Visit    None    Visit Diagnoses    Encounter for supervision of normal first pregnancy in second trimester    -  Primary    Relevant Orders    POCT urinalysis dipstick (Completed)    Headache in pregnancy, antepartum, second trimester        Relevant Medications    butalbital-acetaminophen-caffeine (FIORICET) 50-325-40 MG tablet      Patient Active Problem List   Diagnosis Date Noted  . [redacted] weeks gestation of pregnancy   . Abnormal findings on antenatal screening   . Supervision of normal first pregnancy in first trimester 01/18/2015   Objective:    BP 106/65 mmHg  Pulse 84  Temp(Src) 98.6 F (37 C)  Wt 146 lb (66.225 kg)  LMP 11/09/2014 FHT: 145 BPM  Uterine Size: size equals dates     Assessment:    Pregnancy @ [redacted]w[redacted]d    HA in pregnancy Plan:    OBGCT: discussed. Signs and symptoms of preterm labor: discussed.  Labs, problem list reviewed and updated 2 hr GTT planned Follow up in 4 weeks.

## 2015-03-24 ENCOUNTER — Other Ambulatory Visit: Payer: Self-pay | Admitting: Certified Nurse Midwife

## 2015-04-14 ENCOUNTER — Ambulatory Visit (INDEPENDENT_AMBULATORY_CARE_PROVIDER_SITE_OTHER): Payer: BLUE CROSS/BLUE SHIELD | Admitting: Certified Nurse Midwife

## 2015-04-14 VITALS — BP 100/70 | HR 82 | Temp 98.3°F | Wt 155.0 lb

## 2015-04-14 DIAGNOSIS — Z3402 Encounter for supervision of normal first pregnancy, second trimester: Secondary | ICD-10-CM

## 2015-04-14 LAB — POCT URINALYSIS DIPSTICK
Bilirubin, UA: NEGATIVE
Blood, UA: NEGATIVE
GLUCOSE UA: NEGATIVE
Ketones, UA: NEGATIVE
Leukocytes, UA: NEGATIVE
Nitrite, UA: NEGATIVE
Protein, UA: NEGATIVE
SPEC GRAV UA: 1.02
Urobilinogen, UA: NEGATIVE
pH, UA: 6

## 2015-04-14 NOTE — Progress Notes (Signed)
Subjective:    Jillian Holden is a 30 y.o. female being seen today for her obstetrical visit. She is at 280w2d gestation. Patient reports: no complaints . Fetal movement: normal.  Problem List Items Addressed This Visit    None    Visit Diagnoses    Encounter for supervision of normal first pregnancy in second trimester    -  Primary    Relevant Orders    POCT urinalysis dipstick      Patient Active Problem List   Diagnosis Date Noted  . [redacted] weeks gestation of pregnancy   . Abnormal findings on antenatal screening   . Supervision of normal first pregnancy in first trimester 01/18/2015   Objective:    BP 100/70 mmHg  Pulse 82  Temp(Src) 98.3 F (36.8 C)  Wt 155 lb (70.308 kg)  LMP 11/09/2014 FHT: 153 BPM  Uterine Size: size equals dates     Assessment:    Pregnancy @ 5480w2d    Doing well.   Plan:    OBGCT: discussed. Signs and symptoms of preterm labor: discussed.  Labs, problem list reviewed and updated 2 hr GTT planned Follow up in 4 weeks.

## 2015-04-22 NOTE — Telephone Encounter (Signed)
Patient seen in office since phone call  

## 2015-05-03 ENCOUNTER — Other Ambulatory Visit: Payer: Self-pay | Admitting: Certified Nurse Midwife

## 2015-05-12 ENCOUNTER — Ambulatory Visit (INDEPENDENT_AMBULATORY_CARE_PROVIDER_SITE_OTHER): Payer: BLUE CROSS/BLUE SHIELD | Admitting: Certified Nurse Midwife

## 2015-05-12 VITALS — BP 108/62 | HR 77 | Temp 98.6°F | Wt 163.0 lb

## 2015-05-12 DIAGNOSIS — Z3492 Encounter for supervision of normal pregnancy, unspecified, second trimester: Secondary | ICD-10-CM

## 2015-05-12 LAB — POCT URINALYSIS DIPSTICK
BILIRUBIN UA: NEGATIVE
Blood, UA: NEGATIVE
Glucose, UA: NEGATIVE
KETONES UA: NEGATIVE
Leukocytes, UA: NEGATIVE
Nitrite, UA: NEGATIVE
PH UA: 6
PROTEIN UA: NEGATIVE
Urobilinogen, UA: NEGATIVE

## 2015-05-12 NOTE — Progress Notes (Signed)
Subjective:    Jillian Holden is a 30 y.o. female being seen today for her obstetrical visit. She is at 594w2d gestation. Patient reports: no complaints . Fetal movement: normal.  Problem List Items Addressed This Visit    None    Visit Diagnoses    Prenatal care, second trimester    -  Primary    Relevant Orders    POCT urinalysis dipstick (Completed)      Patient Active Problem List   Diagnosis Date Noted  . [redacted] weeks gestation of pregnancy   . Abnormal findings on antenatal screening   . Supervision of normal first pregnancy in first trimester 01/18/2015   Objective:    BP 108/62 mmHg  Pulse 77  Temp(Src) 98.6 F (37 C)  Wt 163 lb (73.936 kg)  LMP 11/09/2014 FHT: 150 BPM  Uterine Size: size equals dates     Assessment:    Pregnancy @ [redacted]w[redacted]d    Doing well Plan:    OBGCT: discussed and ordered for next visit. Signs and symptoms of preterm labor: discussed.  Labs, problem list reviewed and updated 2 hr GTT planned Follow up in 4 weeks.

## 2015-05-12 NOTE — Progress Notes (Signed)
Patient has no concerns 

## 2015-05-27 ENCOUNTER — Other Ambulatory Visit: Payer: BLUE CROSS/BLUE SHIELD

## 2015-05-27 ENCOUNTER — Ambulatory Visit (INDEPENDENT_AMBULATORY_CARE_PROVIDER_SITE_OTHER): Payer: BLUE CROSS/BLUE SHIELD | Admitting: Certified Nurse Midwife

## 2015-05-27 VITALS — BP 111/69 | HR 85 | Temp 98.9°F | Wt 166.0 lb

## 2015-05-27 DIAGNOSIS — Z3402 Encounter for supervision of normal first pregnancy, second trimester: Secondary | ICD-10-CM

## 2015-05-27 LAB — POCT URINALYSIS DIPSTICK
Bilirubin, UA: NEGATIVE
Blood, UA: NEGATIVE
Glucose, UA: NEGATIVE
KETONES UA: NEGATIVE
LEUKOCYTES UA: NEGATIVE
Nitrite, UA: NEGATIVE
PH UA: 7
PROTEIN UA: NEGATIVE
UROBILINOGEN UA: NEGATIVE

## 2015-05-27 NOTE — Progress Notes (Signed)
Patient reports she is doing well 

## 2015-05-27 NOTE — Progress Notes (Signed)
Subjective:    Jillian Holden is a 30 y.o. female being seen today for her obstetrical visit. She is at 58104w3d gestation. Patient reports no complaints. Fetal movement: normal.  Problem List Items Addressed This Visit    None    Visit Diagnoses    Encounter for supervision of normal first pregnancy in second trimester    -  Primary    Relevant Orders    POCT urinalysis dipstick (Completed)    Glucose Tolerance, 2 Hours w/1 Hour    CBC    HIV antibody    RPR      Patient Active Problem List   Diagnosis Date Noted  . [redacted] weeks gestation of pregnancy   . Abnormal findings on antenatal screening   . Supervision of normal first pregnancy in first trimester 01/18/2015   Objective:    BP 111/69 mmHg  Pulse 85  Temp(Src) 98.9 F (37.2 C)  Wt 166 lb (75.297 kg)  LMP 11/09/2014 FHT:  145 BPM  Uterine Size: size equals dates  Presentation: cephalic     Assessment:    Pregnancy @ 53104w3d weeks   Doing well Plan:   birthing classes: scheduled for June.  2 hour OGTT today   labs reviewed, problem list updated Consent signed. GBS planning TDAP offered  Rhogam given for RH negative Pediatrician: discussed. Infant feeding: plans to breastfeed. Maternity leave: discussed. Cigarette smoking: never smoked. Orders Placed This Encounter  Procedures  . Glucose Tolerance, 2 Hours w/1 Hour  . CBC  . HIV antibody  . RPR  . POCT urinalysis dipstick   No orders of the defined types were placed in this encounter.   Follow up in 2 Weeks.

## 2015-05-28 LAB — GLUCOSE TOLERANCE, 2 HOURS W/ 1HR
GLUCOSE, 1 HOUR: 172 mg/dL (ref 65–179)
GLUCOSE, FASTING: 80 mg/dL (ref 65–91)
Glucose, 2 hour: 124 mg/dL (ref 65–152)

## 2015-05-28 LAB — CBC
Hematocrit: 32 % — ABNORMAL LOW (ref 34.0–46.6)
Hemoglobin: 10 g/dL — ABNORMAL LOW (ref 11.1–15.9)
MCH: 27.8 pg (ref 26.6–33.0)
MCHC: 31.3 g/dL — AB (ref 31.5–35.7)
MCV: 89 fL (ref 79–97)
PLATELETS: 258 10*3/uL (ref 150–379)
RBC: 3.6 x10E6/uL — ABNORMAL LOW (ref 3.77–5.28)
RDW: 13.3 % (ref 12.3–15.4)
WBC: 9.5 10*3/uL (ref 3.4–10.8)

## 2015-05-28 LAB — RPR: RPR Ser Ql: NONREACTIVE

## 2015-05-28 LAB — HIV ANTIBODY (ROUTINE TESTING W REFLEX): HIV SCREEN 4TH GENERATION: NONREACTIVE

## 2015-05-31 ENCOUNTER — Other Ambulatory Visit: Payer: Self-pay | Admitting: Certified Nurse Midwife

## 2015-05-31 ENCOUNTER — Encounter: Payer: Self-pay | Admitting: *Deleted

## 2015-05-31 DIAGNOSIS — O99013 Anemia complicating pregnancy, third trimester: Secondary | ICD-10-CM

## 2015-05-31 MED ORDER — VITAFOL FE+ 90-1-200 & 50 MG PO CPPK: 2.0000 | ORAL_CAPSULE | Freq: Every day | ORAL | Status: DC

## 2015-06-08 ENCOUNTER — Ambulatory Visit (INDEPENDENT_AMBULATORY_CARE_PROVIDER_SITE_OTHER): Payer: BLUE CROSS/BLUE SHIELD | Admitting: Certified Nurse Midwife

## 2015-06-08 VITALS — BP 112/61 | HR 89 | Temp 97.9°F | Wt 170.0 lb

## 2015-06-08 DIAGNOSIS — Z3493 Encounter for supervision of normal pregnancy, unspecified, third trimester: Secondary | ICD-10-CM

## 2015-06-08 LAB — POCT URINALYSIS DIPSTICK
BILIRUBIN UA: NEGATIVE
Blood, UA: NEGATIVE
GLUCOSE UA: NEGATIVE
KETONES UA: NEGATIVE
LEUKOCYTES UA: NEGATIVE
NITRITE UA: NEGATIVE
PH UA: 7
Protein, UA: NEGATIVE
Spec Grav, UA: 1.01
Urobilinogen, UA: NEGATIVE

## 2015-06-08 NOTE — Progress Notes (Signed)
Subjective:    Delsa Bernlyssa Kosanke is a 30 y.o. female being seen today for her obstetrical visit. She is at 8748w1d gestation. Patient reports no complaints. Fetal movement: normal.  Problem List Items Addressed This Visit    None    Visit Diagnoses    Prenatal care in third trimester    -  Primary    Relevant Orders    POCT Urinalysis Dipstick (Completed)      Patient Active Problem List   Diagnosis Date Noted  . [redacted] weeks gestation of pregnancy   . Abnormal findings on antenatal screening   . Supervision of normal first pregnancy in first trimester 01/18/2015   Objective:    BP 112/61 mmHg  Pulse 89  Temp(Src) 97.9 F (36.6 C)  Wt 170 lb (77.111 kg)  LMP 11/09/2014 FHT:  160 BPM  Uterine Size: size equals dates  Presentation: cephalic     Assessment:    Pregnancy @ 748w1d weeks   Doing well  Plan:     labs reviewed, problem list updated Consent signed. GBS sent TDAP offered  Rhogam given for RH negative Pediatrician: discussed. Infant feeding: plans to breastfeed. Maternity leave: discussed. Cigarette smoking: never smoked. Orders Placed This Encounter  Procedures  . POCT Urinalysis Dipstick   No orders of the defined types were placed in this encounter.   Follow up in 2 Weeks.

## 2015-06-21 ENCOUNTER — Ambulatory Visit (INDEPENDENT_AMBULATORY_CARE_PROVIDER_SITE_OTHER): Payer: BLUE CROSS/BLUE SHIELD | Admitting: Certified Nurse Midwife

## 2015-06-21 VITALS — BP 124/63 | HR 97 | Wt 171.0 lb

## 2015-06-21 DIAGNOSIS — Z3493 Encounter for supervision of normal pregnancy, unspecified, third trimester: Secondary | ICD-10-CM

## 2015-06-21 LAB — POCT URINALYSIS DIPSTICK
BILIRUBIN UA: NEGATIVE
GLUCOSE UA: NEGATIVE
KETONES UA: NEGATIVE
Leukocytes, UA: NEGATIVE
Nitrite, UA: NEGATIVE
PROTEIN UA: NEGATIVE
RBC UA: NEGATIVE
UROBILINOGEN UA: NEGATIVE
pH, UA: 7

## 2015-06-21 NOTE — Progress Notes (Signed)
Experienced cramping over the weekend,

## 2015-06-21 NOTE — Progress Notes (Signed)
Subjective:    Jillian Holden is a 30 y.o. female being seen today for her obstetrical visit. She is at 704w0d gestation. Patient reports no complaints. Fetal movement: normal.  Problem List Items Addressed This Visit    None    Visit Diagnoses    Prenatal care in third trimester    -  Primary    Relevant Orders    POCT Urinalysis Dipstick (Completed)      Patient Active Problem List   Diagnosis Date Noted  . [redacted] weeks gestation of pregnancy   . Abnormal findings on antenatal screening   . Supervision of normal first pregnancy in first trimester 01/18/2015   Objective:    BP 124/63 mmHg  Pulse 97  Wt 171 lb (77.565 kg)  LMP 11/09/2014 FHT:  145 BPM  Uterine Size: size equals dates  Presentation: cephalic     Assessment:    Pregnancy @ 264w0d weeks   Plan:     labs reviewed, problem list updated Consent signed. GBS planning TDAP offered  Rhogam given for RH negative Pediatrician: discussed. Infant feeding: plans to breastfeed. Maternity leave: discussed. Cigarette smoking: never smoked. Orders Placed This Encounter  Procedures  . POCT Urinalysis Dipstick   No orders of the defined types were placed in this encounter.   Follow up in 2 Weeks.

## 2015-07-05 ENCOUNTER — Ambulatory Visit (INDEPENDENT_AMBULATORY_CARE_PROVIDER_SITE_OTHER): Payer: BLUE CROSS/BLUE SHIELD | Admitting: Certified Nurse Midwife

## 2015-07-05 VITALS — BP 115/63 | HR 86 | Temp 98.4°F | Wt 172.0 lb

## 2015-07-05 DIAGNOSIS — Z3403 Encounter for supervision of normal first pregnancy, third trimester: Secondary | ICD-10-CM

## 2015-07-05 LAB — POCT URINALYSIS DIPSTICK
BILIRUBIN UA: NEGATIVE
Blood, UA: NEGATIVE
Glucose, UA: NEGATIVE
Ketones, UA: NEGATIVE
NITRITE UA: NEGATIVE
PH UA: 7
PROTEIN UA: NEGATIVE
Spec Grav, UA: 1.015
Urobilinogen, UA: NEGATIVE

## 2015-07-05 NOTE — Progress Notes (Signed)
Subjective:    Jillian Holden is a 30 y.o. female being seen today for her obstetrical visit. She is at 6451w0d gestation. Patient reports no complaints. Fetal movement: normal.  Problem List Items Addressed This Visit    None    Visit Diagnoses    Encounter for supervision of normal first pregnancy in third trimester    -  Primary    Relevant Orders    POCT urinalysis dipstick (Completed)      Patient Active Problem List   Diagnosis Date Noted  . [redacted] weeks gestation of pregnancy   . Abnormal findings on antenatal screening   . Supervision of normal first pregnancy in first trimester 01/18/2015   Objective:    BP 115/63 mmHg  Pulse 86  Temp(Src) 98.4 F (36.9 C)  Wt 172 lb (78.019 kg)  LMP 11/09/2014 FHT:  150 BPM  Uterine Size: size equals dates  Presentation: cephalic     Assessment:    Pregnancy @ 6451w0d weeks   Doing well  Plan:     labs reviewed, problem list updated Consent signed. GBS planning TDAP offered  Rhogam given for RH negative Pediatrician: discussed. Infant feeding: plans to breastfeed. Maternity leave: discussed. Cigarette smoking: never smoked. Orders Placed This Encounter  Procedures  . POCT urinalysis dipstick   No orders of the defined types were placed in this encounter.   Follow up in 2 Weeks with GBS.

## 2015-07-19 ENCOUNTER — Ambulatory Visit (INDEPENDENT_AMBULATORY_CARE_PROVIDER_SITE_OTHER): Payer: BLUE CROSS/BLUE SHIELD | Admitting: Certified Nurse Midwife

## 2015-07-19 VITALS — BP 106/66 | HR 83 | Temp 98.5°F | Wt 176.0 lb

## 2015-07-19 DIAGNOSIS — Z3403 Encounter for supervision of normal first pregnancy, third trimester: Secondary | ICD-10-CM

## 2015-07-19 LAB — POCT URINALYSIS DIPSTICK
Bilirubin, UA: NEGATIVE
Blood, UA: NEGATIVE
Glucose, UA: NEGATIVE
Ketones, UA: NEGATIVE
LEUKOCYTES UA: NEGATIVE
NITRITE UA: NEGATIVE
PH UA: 7
Protein, UA: NEGATIVE
Spec Grav, UA: 1.01
Urobilinogen, UA: NEGATIVE

## 2015-07-19 NOTE — Progress Notes (Signed)
Subjective:    Jillian Holden is a 30 y.o. female being seen today for her obstetrical visit. She is at 2564w0d gestation. Patient reports fatigue, no bleeding, no contractions, no cramping and no leaking. Fetal movement: normal.  Desires Nitrous gas for delivery.  Desires to labor at home for as long as possible.  Problem List Items Addressed This Visit    None    Visit Diagnoses    Encounter for supervision of normal first pregnancy in third trimester    -  Primary    Relevant Orders    POCT urinalysis dipstick (Completed)      Patient Active Problem List   Diagnosis Date Noted  . [redacted] weeks gestation of pregnancy   . Abnormal findings on antenatal screening   . Supervision of normal first pregnancy in first trimester 01/18/2015   Objective:    BP 106/66 mmHg  Pulse 83  Temp(Src) 98.5 F (36.9 C)  Wt 176 lb (79.833 kg)  LMP 11/09/2014 FHT:  138 BPM  Uterine Size: size equals dates  Presentation: cephalic     Assessment:    Pregnancy @ 4164w0d weeks   Plan:     labs reviewed, problem list updated Consent signed. GBS sent TDAP offered  Rhogam given for RH negative Pediatrician: discussed. Infant feeding: plans to breastfeed. Maternity leave: discussed. Cigarette smoking: never smoked. Orders Placed This Encounter  Procedures  . POCT urinalysis dipstick   Meds ordered this encounter  Medications  . OVER THE COUNTER MEDICATION    Sig: daily. Prenatal Vitamins   Follow up in 1 Week.

## 2015-07-19 NOTE — Progress Notes (Signed)
I agree with note by NP Student Andrew Brake.  Was present for exam.  R.Hugh Garrow CNM 

## 2015-07-21 LAB — STREP GP B NAA: Strep Gp B NAA: NEGATIVE

## 2015-07-22 LAB — NUSWAB VG+, CANDIDA 6SP
CANDIDA ALBICANS, NAA: NEGATIVE
CANDIDA LUSITANIAE, NAA: NEGATIVE
CHLAMYDIA TRACHOMATIS, NAA: NEGATIVE
Candida glabrata, NAA: NEGATIVE
Candida krusei, NAA: NEGATIVE
Candida parapsilosis, NAA: NEGATIVE
Candida tropicalis, NAA: NEGATIVE
NEISSERIA GONORRHOEAE, NAA: NEGATIVE
Trich vag by NAA: NEGATIVE

## 2015-07-26 ENCOUNTER — Ambulatory Visit (INDEPENDENT_AMBULATORY_CARE_PROVIDER_SITE_OTHER): Payer: BLUE CROSS/BLUE SHIELD | Admitting: Certified Nurse Midwife

## 2015-07-26 VITALS — BP 112/73 | HR 86 | Wt 179.0 lb

## 2015-07-26 DIAGNOSIS — Z3403 Encounter for supervision of normal first pregnancy, third trimester: Secondary | ICD-10-CM

## 2015-07-26 LAB — POCT URINALYSIS DIPSTICK
BILIRUBIN UA: NEGATIVE
Glucose, UA: NEGATIVE
KETONES UA: NEGATIVE
Leukocytes, UA: NEGATIVE
NITRITE UA: NEGATIVE
PH UA: 7
Protein, UA: NEGATIVE
RBC UA: NEGATIVE
Spec Grav, UA: 1.01
Urobilinogen, UA: NEGATIVE

## 2015-07-26 NOTE — Progress Notes (Signed)
I agree with note by NP Student Andrew Brake.  Was present for exam.  R.Cleveland Yarbro CNM 

## 2015-07-26 NOTE — Progress Notes (Signed)
Subjective:    Jillian Holden is a 30 y.o. female being seen today for her obstetrical visit. She is at 5965w0d gestation. Patient reports backache, headache, occasional contractions and cramping. Fetal movement: normal.  Problem List Items Addressed This Visit    None    Visit Diagnoses    Encounter for supervision of normal first pregnancy in third trimester    -  Primary    Relevant Orders    POCT urinalysis dipstick (Completed)      Patient Active Problem List   Diagnosis Date Noted  . [redacted] weeks gestation of pregnancy   . Abnormal findings on antenatal screening   . Supervision of normal first pregnancy in first trimester 01/18/2015    Objective:    BP 112/73 mmHg  Pulse 86  Wt 179 lb (81.194 kg)  LMP 11/09/2014 FHT: 140 BPM  Uterine Size: size equals dates  Presentations: cephalic     Assessment:    Pregnancy @ 7965w0d weeks    Normal complaints of pregnancy.  Plan:   Plans for delivery: Vaginal anticipated; labs reviewed; problem list updated Counseling: Consent signed. Infant feeding: plans to breastfeed. Cigarette smoking: never smoked. L&D discussion: symptoms of labor, discussed when to call, discussed what number to call, anesthetic/analgesic options reviewed. Postpartum supports and preparation: circumcision discussed and contraception plans discussed.  Follow up in 1 Week.

## 2015-08-03 ENCOUNTER — Inpatient Hospital Stay (HOSPITAL_COMMUNITY)
Admission: AD | Admit: 2015-08-03 | Discharge: 2015-08-03 | Disposition: A | Discharge status: Home / Self Care | Payer: BLUE CROSS/BLUE SHIELD | Source: Ambulatory Visit | Attending: Obstetrics and Gynecology | Admitting: Obstetrics and Gynecology

## 2015-08-03 ENCOUNTER — Encounter (HOSPITAL_COMMUNITY): Payer: Self-pay | Admitting: *Deleted

## 2015-08-03 DIAGNOSIS — Z3A38 38 weeks gestation of pregnancy: Secondary | ICD-10-CM | POA: Diagnosis not present

## 2015-08-03 DIAGNOSIS — O471 False labor at or after 37 completed weeks of gestation: Secondary | ICD-10-CM | POA: Diagnosis not present

## 2015-08-03 DIAGNOSIS — Z3689 Encounter for other specified antenatal screening: Secondary | ICD-10-CM

## 2015-08-03 DIAGNOSIS — O4202 Full-term premature rupture of membranes, onset of labor within 24 hours of rupture: Secondary | ICD-10-CM | POA: Insufficient documentation

## 2015-08-03 LAB — POCT FERN TEST: POCT FERN TEST: NEGATIVE

## 2015-08-03 NOTE — MAU Note (Addendum)
Has been having really intense pressure, some contractions (twice an hour).  Has felt an ongoing trickling, clear fluid. No bleeding.last appt was 1 cm 2 wks ago.

## 2015-08-03 NOTE — Discharge Instructions (Signed)
Fetal Movement Counts Patient Name: __________________________________________________ Patient Due Date: ____________________ Performing a fetal movement count is highly recommended in high-risk pregnancies, but it is good for every pregnant woman to do. Your health care provider may ask you to start counting fetal movements at 28 weeks of the pregnancy. Fetal movements often increase:  After eating a full meal.  After physical activity.  After eating or drinking something sweet or cold.  At rest. Pay attention to when you feel the baby is most active. This will help you notice a pattern of your baby's sleep and wake cycles and what factors contribute to an increase in fetal movement. It is important to perform a fetal movement count at the same time each day when your baby is normally most active.  HOW TO COUNT FETAL MOVEMENTS 1. Find a quiet and comfortable area to sit or lie down on your left side. Lying on your left side provides the best blood and oxygen circulation to your baby. 2. Write down the day and time on a sheet of paper or in a journal. 3. Start counting kicks, flutters, swishes, rolls, or jabs in a 2-hour period. You should feel at least 10 movements within 2 hours. 4. If you do not feel 10 movements in 2 hours, wait 2-3 hours and count again. Look for a change in the pattern or not enough counts in 2 hours. SEEK MEDICAL CARE IF:  You feel less than 10 counts in 2 hours, tried twice.  There is no movement in over an hour.  The pattern is changing or taking longer each day to reach 10 counts in 2 hours.  You feel the baby is not moving as he or she usually does. Date: ____________ Movements: ____________ Start time: ____________ Finish time: ____________  Date: ____________ Movements: ____________ Start time: ____________ Finish time: ____________ Date: ____________ Movements: ____________ Start time: ____________ Finish time: ____________ Date: ____________ Movements:  ____________ Start time: ____________ Finish time: ____________ Date: ____________ Movements: ____________ Start time: ____________ Finish time: ____________ Date: ____________ Movements: ____________ Start time: ____________ Finish time: ____________ Date: ____________ Movements: ____________ Start time: ____________ Finish time: ____________ Date: ____________ Movements: ____________ Start time: ____________ Finish time: ____________  Date: ____________ Movements: ____________ Start time: ____________ Finish time: ____________ Date: ____________ Movements: ____________ Start time: ____________ Finish time: ____________ Date: ____________ Movements: ____________ Start time: ____________ Finish time: ____________ Date: ____________ Movements: ____________ Start time: ____________ Finish time: ____________ Date: ____________ Movements: ____________ Start time: ____________ Finish time: ____________ Date: ____________ Movements: ____________ Start time: ____________ Finish time: ____________ Date: ____________ Movements: ____________ Start time: ____________ Finish time: ____________  Date: ____________ Movements: ____________ Start time: ____________ Finish time: ____________ Date: ____________ Movements: ____________ Start time: ____________ Finish time: ____________ Date: ____________ Movements: ____________ Start time: ____________ Finish time: ____________ Date: ____________ Movements: ____________ Start time: ____________ Finish time: ____________ Date: ____________ Movements: ____________ Start time: ____________ Finish time: ____________ Date: ____________ Movements: ____________ Start time: ____________ Finish time: ____________ Date: ____________ Movements: ____________ Start time: ____________ Finish time: ____________  Date: ____________ Movements: ____________ Start time: ____________ Finish time: ____________ Date: ____________ Movements: ____________ Start time: ____________ Finish  time: ____________ Date: ____________ Movements: ____________ Start time: ____________ Finish time: ____________ Date: ____________ Movements: ____________ Start time: ____________ Finish time: ____________ Date: ____________ Movements: ____________ Start time: ____________ Finish time: ____________ Date: ____________ Movements: ____________ Start time: ____________ Finish time: ____________ Date: ____________ Movements: ____________ Start time: ____________ Finish time: ____________  Date: ____________ Movements: ____________ Start time: ____________ Finish   time: ____________ Date: ____________ Movements: ____________ Start time: ____________ Finish time: ____________ Date: ____________ Movements: ____________ Start time: ____________ Finish time: ____________ Date: ____________ Movements: ____________ Start time: ____________ Finish time: ____________ Date: ____________ Movements: ____________ Start time: ____________ Finish time: ____________ Date: ____________ Movements: ____________ Start time: ____________ Finish time: ____________ Date: ____________ Movements: ____________ Start time: ____________ Finish time: ____________  Date: ____________ Movements: ____________ Start time: ____________ Finish time: ____________ Date: ____________ Movements: ____________ Start time: ____________ Finish time: ____________ Date: ____________ Movements: ____________ Start time: ____________ Finish time: ____________ Date: ____________ Movements: ____________ Start time: ____________ Finish time: ____________ Date: ____________ Movements: ____________ Start time: ____________ Finish time: ____________ Date: ____________ Movements: ____________ Start time: ____________ Finish time: ____________ Date: ____________ Movements: ____________ Start time: ____________ Finish time: ____________  Date: ____________ Movements: ____________ Start time: ____________ Finish time: ____________ Date: ____________  Movements: ____________ Start time: ____________ Finish time: ____________ Date: ____________ Movements: ____________ Start time: ____________ Finish time: ____________ Date: ____________ Movements: ____________ Start time: ____________ Finish time: ____________ Date: ____________ Movements: ____________ Start time: ____________ Finish time: ____________ Date: ____________ Movements: ____________ Start time: ____________ Finish time: ____________ Date: ____________ Movements: ____________ Start time: ____________ Finish time: ____________  Date: ____________ Movements: ____________ Start time: ____________ Finish time: ____________ Date: ____________ Movements: ____________ Start time: ____________ Finish time: ____________ Date: ____________ Movements: ____________ Start time: ____________ Finish time: ____________ Date: ____________ Movements: ____________ Start time: ____________ Finish time: ____________ Date: ____________ Movements: ____________ Start time: ____________ Finish time: ____________ Date: ____________ Movements: ____________ Start time: ____________ Finish time: ____________   This information is not intended to replace advice given to you by your health care provider. Make sure you discuss any questions you have with your health care provider.   Document Released: 02/14/2006 Document Revised: 02/05/2014 Document Reviewed: 11/12/2011 Elsevier Interactive Patient Education 2016 [employer] Braxton Hicks Contractions Contractions of the uterus can occur throughout pregnancy. Contractions are not always a sign that you are in labor.  WHAT ARE BRAXTON HICKS CONTRACTIONS?  Contractions that occur before labor are called Braxton Hicks contractions, or false labor. Toward the end of pregnancy (32-34 weeks), these contractions can develop more often and may become more forceful. This is not true labor because these contractions do not result in opening (dilatation) and thinning of  the cervix. They are sometimes difficult to tell apart from true labor because these contractions can be forceful and people have different pain tolerances. You should not feel embarrassed if you go to the hospital with false labor. Sometimes, the only way to tell if you are in true labor is for your health care provider to look for changes in the cervix. If there are no prenatal problems or other health problems associated with the pregnancy, it is completely safe to be sent home with false labor and await the onset of true labor. HOW CAN YOU TELL THE DIFFERENCE BETWEEN TRUE AND FALSE LABOR? False Labor  The contractions of false labor are usually shorter and not as hard as those of true labor.   The contractions are usually irregular.   The contractions are often felt in the front of the lower abdomen and in the groin.   The contractions may go away when you walk around or change positions while lying down.   The contractions get weaker and are shorter lasting as time goes on.   The contractions do not usually become progressively stronger, regular, and closer together as with true labor.  True Labor 5. Contractions in true   labor last 30-70 seconds, become very regular, usually become more intense, and increase in frequency.  6. The contractions do not go away with walking.  7. The discomfort is usually felt in the top of the uterus and spreads to the lower abdomen and low back.  8. True labor can be determined by your health care provider with an exam. This will show that the cervix is dilating and getting thinner.  WHAT TO REMEMBER  Keep up with your usual exercises and follow other instructions given by your health care provider.   Take medicines as directed by your health care provider.   Keep your regular prenatal appointments.   Eat and drink lightly if you think you are going into labor.   If Braxton Hicks contractions are making you uncomfortable:   Change  your position from lying down or resting to walking, or from walking to resting.   Sit and rest in a tub of warm water.   Drink 2-3 glasses of water. Dehydration may cause these contractions.   Do slow and deep breathing several times an hour.  WHEN SHOULD I SEEK IMMEDIATE MEDICAL CARE? Seek immediate medical care if:  Your contractions become stronger, more regular, and closer together.   You have fluid leaking or gushing from your vagina.   You have a fever.   You pass blood-tinged mucus.   You have vaginal bleeding.   You have continuous abdominal pain.   You have low back pain that you never had before.   You feel your baby's head pushing down and causing pelvic pressure.   Your baby is not moving as much as it used to.    This information is not intended to replace advice given to you by your health care provider. Make sure you discuss any questions you have with your health care provider.   Document Released: 01/15/2005 Document Revised: 01/20/2013 Document Reviewed: 10/27/2012 Elsevier Interactive Patient Education 2016 Elsevier Inc[employer]  

## 2015-08-03 NOTE — MAU Provider Note (Signed)
Ms. Jillian Holden is a 30 y.o. G1P0000 at 7828w1d  who presents to MAU today complaining of LOF for the last few hours. The patient states small "gushes" of clear fluid. She denies vaginal bleeding today. She states 2-3 contractions/hour recently. She reports good fetal movement and denies complications with the pregnancy. Patient states last cervical exam at 36 weeks was 1 cm dilated.   BP 120/65 mmHg  Pulse 90  Temp(Src) 98.4 F (36.9 C) (Oral)  Resp 18  LMP 11/09/2014 GENERAL: Well-developed, well-nourished female in no acute distress.  HEAD: Normocephalic, atraumatic.  CHEST: Normal effort of breathing, regular heart rate ABDOMEN: Soft, nontender, nondistended.  PELVIC: Normal external female genitalia. Vagina is pink and rugated.  Normal discharge.  Negative pooling. Gravid uterus.   EXTREMITIES: No cyanosis, clubbing, or edema Dilation: 1 Effacement (%): Thick Exam by:: Harlon FlorJ. Hydeia Mcatee, PA  Crist FatFern - negative  Fetal Monitoring:  Baseline: 130 bpm, moderate variability, + accelerations, no decelerations Contractions: moderate UI, irregular contractions  A: SIUP at 1028w1d Membranes intact Uterine contractions  P:  Discharge home Labor precautions discussed Patient advised to follow-up with [facility] as scheduled for routine prenatal care or sooner PRN Patient may return to MAU as needed or if her condition were to change or worsen   Marny LowensteinJulie N Abbigail Anstey, PA-C 08/03/2015 6:27 PM

## 2015-08-03 NOTE — MAU Note (Signed)
Urine sent to lab 

## 2015-08-05 ENCOUNTER — Ambulatory Visit (INDEPENDENT_AMBULATORY_CARE_PROVIDER_SITE_OTHER): Payer: BLUE CROSS/BLUE SHIELD | Admitting: Certified Nurse Midwife

## 2015-08-05 VITALS — BP 128/83 | HR 86 | Temp 98.6°F | Wt 181.0 lb

## 2015-08-05 DIAGNOSIS — Z3403 Encounter for supervision of normal first pregnancy, third trimester: Secondary | ICD-10-CM

## 2015-08-05 DIAGNOSIS — Z3401 Encounter for supervision of normal first pregnancy, first trimester: Secondary | ICD-10-CM

## 2015-08-05 LAB — POCT URINALYSIS DIPSTICK
BILIRUBIN UA: NEGATIVE
Blood, UA: 250
Glucose, UA: NEGATIVE
KETONES UA: NEGATIVE
LEUKOCYTES UA: NEGATIVE
Nitrite, UA: NEGATIVE
PROTEIN UA: NEGATIVE
SPEC GRAV UA: 1.015
Urobilinogen, UA: NEGATIVE
pH, UA: 6

## 2015-08-05 NOTE — Progress Notes (Signed)
Subjective:    Jillian Holden is a 30 y.o. female being seen today for her obstetrical visit. She is at 8023w3d gestation. Patient reports no complaints. Fetal movement: normal.  Problem List Items Addressed This Visit      Other   Supervision of normal first pregnancy in first trimester    Other Visit Diagnoses    Encounter for supervision of normal first pregnancy in third trimester    -  Primary    Relevant Orders    POCT urinalysis dipstick (Completed)    Culture, OB Urine      Patient Active Problem List   Diagnosis Date Noted  . [redacted] weeks gestation of pregnancy   . Abnormal findings on antenatal screening   . Supervision of normal first pregnancy in first trimester 01/18/2015    Objective:    BP 128/83 mmHg  Pulse 86  Temp(Src) 98.6 F (37 C)  Wt 181 lb (82.101 kg)  LMP 11/09/2014 FHT: 150 BPM  Uterine Size: size equals dates  Presentations: cephalic  Pelvic Exam:              Dilation: 1cm       Effacement: Long             Station:  -3    Consistency: medium            Position: anterior     Assessment:    Pregnancy @ 2023w3d weeks   Doing well  Plan:   Plans for delivery: Vaginal anticipated; labs reviewed; problem list updated Counseling: Consent signed. Infant feeding: plans to breastfeed. Cigarette smoking: never smoked. L&D discussion: symptoms of labor, discussed when to call, discussed what number to call, anesthetic/analgesic options reviewed and delivering clinician:  plans no preference. Postpartum supports and preparation: circumcision discussed and contraception plans discussed.  Follow up in 1 Week.

## 2015-08-07 LAB — URINE CULTURE, OB REFLEX

## 2015-08-07 LAB — CULTURE, OB URINE

## 2015-08-09 ENCOUNTER — Encounter (HOSPITAL_COMMUNITY): Payer: Self-pay

## 2015-08-09 ENCOUNTER — Other Ambulatory Visit: Payer: Self-pay | Admitting: Certified Nurse Midwife

## 2015-08-09 ENCOUNTER — Inpatient Hospital Stay (HOSPITAL_COMMUNITY)
Admission: AD | Admit: 2015-08-09 | Discharge: 2015-08-09 | Disposition: A | Discharge status: Home / Self Care | Payer: BLUE CROSS/BLUE SHIELD | Source: Ambulatory Visit | Attending: Obstetrics & Gynecology | Admitting: Obstetrics & Gynecology

## 2015-08-09 DIAGNOSIS — O479 False labor, unspecified: Secondary | ICD-10-CM

## 2015-08-09 DIAGNOSIS — O2343 Unspecified infection of urinary tract in pregnancy, third trimester: Secondary | ICD-10-CM

## 2015-08-09 MED ORDER — NITROFURANTOIN MONOHYD MACRO 100 MG PO CAPS: 100.0000 mg | ORAL_CAPSULE | Freq: Two times a day (BID) | ORAL | Status: DC

## 2015-08-09 NOTE — MAU Note (Signed)
Contractions started Sunday night.  They had gotten to be about 5 minutes apart yesterday.  Have spread out to about 12 minutes apart today, but are longer and more intense.

## 2015-08-10 ENCOUNTER — Ambulatory Visit (INDEPENDENT_AMBULATORY_CARE_PROVIDER_SITE_OTHER): Payer: BLUE CROSS/BLUE SHIELD | Admitting: Certified Nurse Midwife

## 2015-08-10 ENCOUNTER — Encounter (HOSPITAL_COMMUNITY): Payer: Self-pay | Admitting: *Deleted

## 2015-08-10 ENCOUNTER — Encounter: Payer: BLUE CROSS/BLUE SHIELD | Admitting: Certified Nurse Midwife

## 2015-08-10 ENCOUNTER — Inpatient Hospital Stay (HOSPITAL_COMMUNITY)
Admission: AD | Admit: 2015-08-10 | Discharge: 2015-08-12 | DRG: 780 | Disposition: A | Discharge status: Home / Self Care | Payer: BLUE CROSS/BLUE SHIELD | Source: Ambulatory Visit | Attending: Obstetrics & Gynecology | Admitting: Obstetrics & Gynecology

## 2015-08-10 VITALS — BP 116/72 | HR 109 | Temp 98.7°F | Wt 181.0 lb

## 2015-08-10 DIAGNOSIS — O9989 Other specified diseases and conditions complicating pregnancy, childbirth and the puerperium: Secondary | ICD-10-CM | POA: Diagnosis not present

## 2015-08-10 DIAGNOSIS — IMO0001 Reserved for inherently not codable concepts without codable children: Secondary | ICD-10-CM

## 2015-08-10 DIAGNOSIS — O471 False labor at or after 37 completed weeks of gestation: Secondary | ICD-10-CM | POA: Diagnosis present

## 2015-08-10 DIAGNOSIS — Z3403 Encounter for supervision of normal first pregnancy, third trimester: Secondary | ICD-10-CM

## 2015-08-10 DIAGNOSIS — Z3A39 39 weeks gestation of pregnancy: Secondary | ICD-10-CM

## 2015-08-10 DIAGNOSIS — Z6841 Body Mass Index (BMI) 40.0 and over, adult: Secondary | ICD-10-CM | POA: Diagnosis not present

## 2015-08-10 DIAGNOSIS — O99824 Streptococcus B carrier state complicating childbirth: Secondary | ICD-10-CM | POA: Diagnosis not present

## 2015-08-10 HISTORY — DX: Bipolar disorder, unspecified: F31.9

## 2015-08-10 LAB — POCT URINALYSIS DIPSTICK
BILIRUBIN UA: NEGATIVE
Blood, UA: NEGATIVE
Glucose, UA: NEGATIVE
KETONES UA: 1
LEUKOCYTES UA: NEGATIVE
Nitrite, UA: NEGATIVE
Spec Grav, UA: <=1.005
Urobilinogen, UA: NEGATIVE
pH, UA: 6

## 2015-08-10 LAB — CBC
HCT: 27.2 % — ABNORMAL LOW (ref 36.0–46.0)
Hemoglobin: 8.2 g/dL — ABNORMAL LOW (ref 12.0–15.0)
MCH: 23 pg — ABNORMAL LOW (ref 26.0–34.0)
MCHC: 30.1 g/dL (ref 30.0–36.0)
MCV: 76.4 fL — ABNORMAL LOW (ref 78.0–100.0)
PLATELETS: 198 10*3/uL (ref 150–400)
RBC: 3.56 MIL/uL — AB (ref 3.87–5.11)
RDW: 17.5 % — AB (ref 11.5–15.5)
WBC: 15.3 10*3/uL — AB (ref 4.0–10.5)

## 2015-08-10 LAB — TYPE AND SCREEN
ABO/RH(D): O POS
Antibody Screen: NEGATIVE

## 2015-08-10 LAB — ABO/RH: ABO/RH(D): O POS

## 2015-08-10 MED ORDER — OXYTOCIN BOLUS FROM INFUSION: 500.0000 mL | INTRAVENOUS | Status: DC

## 2015-08-10 MED ORDER — OXYCODONE-ACETAMINOPHEN 5-325 MG PO TABS: 2.0000 | ORAL_TABLET | ORAL | Status: DC | PRN

## 2015-08-10 MED ORDER — OXYTOCIN 40 UNITS IN LACTATED RINGERS INFUSION - SIMPLE MED
1.0000 m[IU]/min | INTRAVENOUS | Status: DC
  Administered 2015-08-10: 2 m[IU]/min via INTRAVENOUS

## 2015-08-10 MED ORDER — LACTATED RINGERS IV SOLN
INTRAVENOUS | Status: DC
  Administered 2015-08-10: 20:00:00 via INTRAVENOUS

## 2015-08-10 MED ORDER — LACTATED RINGERS IV SOLN
INTRAVENOUS | Status: DC
  Administered 2015-08-10: 14:00:00 via INTRAVENOUS

## 2015-08-10 MED ORDER — TERBUTALINE SULFATE 1 MG/ML IJ SOLN
0.2500 mg | Freq: Once | INTRAMUSCULAR | Status: DC | PRN
  Filled 2015-08-10: qty 1

## 2015-08-10 MED ORDER — FENTANYL CITRATE (PF) 100 MCG/2ML IJ SOLN
100.0000 ug | INTRAMUSCULAR | Status: DC | PRN
Start: 2015-08-10 — End: 2015-08-12

## 2015-08-10 MED ORDER — LIDOCAINE HCL (PF) 1 % IJ SOLN
30.0000 mL | INTRAMUSCULAR | Status: DC | PRN
  Administered 2015-08-11: 30 mL via SUBCUTANEOUS
  Filled 2015-08-10: qty 30

## 2015-08-10 MED ORDER — OXYTOCIN 40 UNITS IN LACTATED RINGERS INFUSION - SIMPLE MED
2.5000 [IU]/h | INTRAVENOUS | Status: DC
  Filled 2015-08-10: qty 1000

## 2015-08-10 MED ORDER — OXYCODONE-ACETAMINOPHEN 5-325 MG PO TABS: 1.0000 | ORAL_TABLET | ORAL | Status: DC | PRN

## 2015-08-10 MED ORDER — ONDANSETRON HCL 4 MG/2ML IJ SOLN
4.0000 mg | Freq: Four times a day (QID) | INTRAMUSCULAR | Status: DC | PRN
  Administered 2015-08-10: 4 mg via INTRAVENOUS
  Filled 2015-08-10: qty 2

## 2015-08-10 MED ORDER — ACETAMINOPHEN 325 MG PO TABS: 650.0000 mg | ORAL_TABLET | ORAL | Status: DC | PRN

## 2015-08-10 MED ORDER — SOD CITRATE-CITRIC ACID 500-334 MG/5ML PO SOLN: 30.0000 mL | ORAL | Status: DC | PRN

## 2015-08-10 MED ORDER — LACTATED RINGERS IV SOLN: 500.0000 mL | INTRAVENOUS | Status: DC | PRN

## 2015-08-10 NOTE — Anesthesia Pain Management Evaluation Note (Signed)
  CRNA Pain Management Visit Note  Patient: Jillian Holden, 30 y.o., female  "Hello I am a [facility] of the anesthesia [facility]. We have an anesthesia team available at all times to provide care throughout the hospital, including epidural management and anesthesia for C-section. I don't know your plan for the delivery whether it a natural birth, water birth, IV sedation, nitrous supplementation, doula or epidural, but we want to meet your pain goals."   1.Was your pain managed to your expectations on prior hospitalizations?   No prior hospitalizations  2.What is your expectation for pain management during this hospitalization?     Nitrous Oxide and open to other options but no definite plan yet  3.How can we help you reach that goal? N2O and open to other options.  Record the patient's initial score and the patient's pain goal.   Pain: 0, but 8 during contractions (N2O helping, per patient)  Pain Goal: 9 [facility] wants you to be able to say your pain was always managed very well.  Deriyah Kunath L 08/10/2015

## 2015-08-10 NOTE — Progress Notes (Signed)
Thin, yellowish discharge- for a couple days.

## 2015-08-10 NOTE — Progress Notes (Signed)
Jillian Holden is a 30 y.o. G1P0000 at 3822w1d by LMP admitted for active labor  Subjective:   Objective: BP 115/68 mmHg  Pulse 97  Temp(Src) 98.2 F (36.8 C) (Oral)  Ht 5\' 5"  (1.651 m)  Wt 181 lb (82.101 kg)  BMI 30.12 kg/m2  SpO2 97%  LMP 11/09/2014      FHT:  FHR: 155 bpm, variability: minimal ,  accelerations:  Present,  decelerations:  Present variable UC:   irregular, every 3-8 minutes SVE:   Dilation: 5 Effacement (%): 90 Station: 0 Exam by:: Jillian Arbouresmaine Lewis, RN  Labs: Lab Results  Component Value Date   WBC 15.3* 08/10/2015   HGB 8.2* 08/10/2015   HCT 27.2* 08/10/2015   MCV 76.4* 08/10/2015   PLT 198 08/10/2015    Assessment / Plan: Spontaneous labor, progressing normally  Labor: Progressing normally Preeclampsia:  no signs or symptoms of toxicity Fetal Wellbeing:  Category II Pain Control:  Nitrous Oxide I/D:  n/a Anticipated MOD:  NSVD  Jillian Holden, CNM 08/10/2015, 5:43 PM

## 2015-08-10 NOTE — MAU Note (Signed)
Pt sent from dr's office with non reactive nST. Pt reports contractions getting stronger and feeling baby moving less than usual.  Denies any vag bleeding or leaking.

## 2015-08-10 NOTE — Progress Notes (Signed)
Subjective:    Jillian Holden is a 30 y.o. female being seen today for her obstetrical visit. She is at 447w1d gestation. Patient reports contractions since Monday about every 6 minutes. Fetal movement: normal.  Problem List Items Addressed This Visit    None    Visit Diagnoses    Encounter for supervision of normal first pregnancy in third trimester    -  Primary    Relevant Orders    POCT urinalysis dipstick (Completed)    Fetal non-stress test      Patient Active Problem List   Diagnosis Date Noted  . Active labor at term 08/10/2015  . [redacted] weeks gestation of pregnancy   . Abnormal findings on antenatal screening   . Supervision of normal first pregnancy in first trimester 01/18/2015    Objective:    BP 116/72 mmHg  Pulse 109  Temp(Src) 98.7 F (37.1 C)  Wt 181 lb (82.101 kg)  LMP 11/09/2014 FHT: 153 BPM  Uterine Size: size equals dates  Presentations: cephalic  Pelvic Exam: deferred    NST: + accels, +variables, minimal variability, ? Late decel after contraction noted, Cat. 2 tracing. Contractions every 6-10 minutes on toco.   Assessment:    Pregnancy @ 6147w1d weeks   Contractions at term  Non reactive NST with variables Plan:    Sent to MAU for evaluation of contractions and non-reactive NST with variables.  Plans for delivery: Vaginal anticipated; labs reviewed; problem list updated Counseling: Consent signed. Infant feeding: plans to breastfeed. Cigarette smoking: never smoked. L&D discussion: symptoms of labor, discussed when to call, discussed what number to call, anesthetic/analgesic options reviewed and delivering clinician:  plans Certified Nurse-Midwife. Postpartum supports and preparation: circumcision discussed and contraception plans discussed.  Follow up in 1 Week.

## 2015-08-10 NOTE — MAU Note (Signed)
Urine in lab 

## 2015-08-10 NOTE — H&P (Signed)
Jillian Holden is a 30 y.o. female presenting for contractions and decreased fetal movement.   RN Note: .Pt sent from dr's office with non reactive nST. Pt reports contractions getting stronger and feeling baby moving less than usual. Denies any vag bleeding or leaking  Maternal Medical History:  Reason for admission: Contractions.  Nausea.  Contractions: Onset was 3-5 hours ago.   Frequency: regular.   Perceived severity is moderate.    Fetal activity: Perceived fetal activity is normal.   Last perceived fetal movement was within the past hour.    Prenatal complications: Bleeding.   No HIV, PIH, infection or preterm labor.   Prenatal Complications - Diabetes: none.    OB History    Gravida Para Term Preterm AB TAB SAB Ectopic Multiple Living   1    0  0   0     Past Medical History  Diagnosis Date  . Bipolar 2 disorder (HCC)   . Insomnia   . Anxiety   . PTSD (post-traumatic stress disorder)   . Bipolar 1 disorder [facility])    Past Surgical History  Procedure Laterality Date  . No past surgeries     Family History: family history is not on file. Social History:  reports that she has never smoked. She has never used smokeless tobacco. She reports that she does not drink alcohol or use illicit drugs.   Prenatal Transfer Tool  Maternal Diabetes: No Genetic Screening: Normal Maternal Ultrasounds/Referrals: Normal Fetal Ultrasounds or other Referrals:  None Maternal Substance Abuse:  No Significant Maternal Medications:  None Significant Maternal Lab Results:  None GBS Neg Other Comments:  None  Review of Systems  Constitutional: Negative for fever and chills.  Respiratory: Negative for shortness of breath.   Gastrointestinal: Positive for abdominal pain. Negative for nausea, vomiting, diarrhea and constipation.  Genitourinary: Negative for dysuria.  Musculoskeletal: Positive for back pain.  Neurological: Negative for dizziness, focal weakness and headaches.     Dilation: 4 Effacement (%): 100 Station: -2 Exam by:: Domenick Bookbinder Blood pressure 116/65, temperature 99.4 F (37.4 C), temperature source Oral, height  (1.651 m), weight 181 lb (82.101 kg), last menstrual period 11/09/2014. Maternal Exam:  Uterine Assessment: Contraction strength is firm.  Contraction frequency is regular.   Abdomen: Patient reports no abdominal tenderness. Fundal height is 38.   Estimated fetal weight is 7.   Fetal presentation: vertex  Introitus: Normal vulva. Vagina is positive for vaginal discharge (bloody show).  Ferning test: not done.  Nitrazine test: not done. Amniotic fluid character: not assessed.  Pelvis: adequate for delivery.   Cervix: Cervix evaluated by digital exam.     Fetal Exam Fetal Monitor Review: Mode: ultrasound.   Baseline rate: 140.  Variability: moderate (6-25 bpm).   Pattern: accelerations present and no decelerations.    Fetal State Assessment: Category I - tracings are normal.     Physical Exam  Constitutional: She is oriented to person, place, and time. She appears well-developed and well-nourished. No distress (but uncomfortable).  HENT:  Head: Normocephalic.  Cardiovascular: Normal rate and regular rhythm.  Exam reveals no gallop and no friction rub.   No murmur heard. Respiratory: Effort normal and breath sounds normal. No respiratory distress. She has no wheezes. She has no rales.  GI: Soft. She exhibits no distension. There is no tenderness. There is no rebound and no guarding.  Genitourinary: Vaginal discharge (bloody show) found.  Musculoskeletal: Normal range of motion.  Neurological: She is alert and oriented  to person, place, and time.  Skin: Skin is warm and dry.  Psychiatric: She has a normal mood and affect.    Prenatal labs: ABO, Rh: O/POS/-- (12/20 1344) Antibody: NEG (12/20 1344) Rubella: 2.11 (12/20 1344) RPR: Non Reactive (04/28 1053)  HBsAg: NEGATIVE (12/20 1344)  HIV: Non Reactive  (04/28 1053)  GBS: Negative (06/20 1307)   Assessment/Plan: SIUP  At 4823w1d  Active Labor GBS neg  [facility] Routine orders Anticipate SVD May want Nitrous Oxide   Wynelle BourgeoisWILLIAMS,Matelyn Antonelli 08/10/2015, 2:21 PM

## 2015-08-11 ENCOUNTER — Encounter (HOSPITAL_COMMUNITY): Payer: Self-pay

## 2015-08-11 DIAGNOSIS — O99344 Other mental disorders complicating childbirth: Secondary | ICD-10-CM

## 2015-08-11 DIAGNOSIS — O99824 Streptococcus B carrier state complicating childbirth: Secondary | ICD-10-CM

## 2015-08-11 DIAGNOSIS — F329 Major depressive disorder, single episode, unspecified: Secondary | ICD-10-CM

## 2015-08-11 DIAGNOSIS — Z3A37 37 weeks gestation of pregnancy: Secondary | ICD-10-CM

## 2015-08-11 DIAGNOSIS — Z6841 Body Mass Index (BMI) 40.0 and over, adult: Secondary | ICD-10-CM

## 2015-08-11 LAB — RPR: RPR: NONREACTIVE

## 2015-08-11 MED ORDER — SOD CITRATE-CITRIC ACID 500-334 MG/5ML PO SOLN: 30.0000 mL | ORAL | Status: DC | PRN

## 2015-08-11 MED ORDER — OXYCODONE-ACETAMINOPHEN 5-325 MG PO TABS: 2.0000 | ORAL_TABLET | ORAL | Status: DC | PRN

## 2015-08-11 MED ORDER — ONDANSETRON HCL 4 MG PO TABS: 4.0000 mg | ORAL_TABLET | ORAL | Status: DC | PRN

## 2015-08-11 MED ORDER — WITCH HAZEL-GLYCERIN EX PADS: 1.0000 "application " | MEDICATED_PAD | CUTANEOUS | Status: DC | PRN

## 2015-08-11 MED ORDER — MEDROXYPROGESTERONE ACETATE 150 MG/ML IM SUSP: 150.0000 mg | INTRAMUSCULAR | Status: DC | PRN

## 2015-08-11 MED ORDER — DIBUCAINE 1 % RE OINT: 1.0000 "application " | TOPICAL_OINTMENT | RECTAL | Status: DC | PRN

## 2015-08-11 MED ORDER — ONDANSETRON HCL 4 MG/2ML IJ SOLN: 4.0000 mg | INTRAMUSCULAR | Status: DC | PRN

## 2015-08-11 MED ORDER — LACTATED RINGERS IV SOLN: 500.0000 mL | INTRAVENOUS | Status: DC | PRN

## 2015-08-11 MED ORDER — METHYLERGONOVINE MALEATE 0.2 MG PO TABS: 0.2000 mg | ORAL_TABLET | ORAL | Status: DC | PRN

## 2015-08-11 MED ORDER — ACETAMINOPHEN 325 MG PO TABS: 650.0000 mg | ORAL_TABLET | ORAL | Status: DC | PRN

## 2015-08-11 MED ORDER — BISACODYL 10 MG RE SUPP: 10.0000 mg | Freq: Every day | RECTAL | Status: DC | PRN

## 2015-08-11 MED ORDER — OXYCODONE-ACETAMINOPHEN 5-325 MG PO TABS: 1.0000 | ORAL_TABLET | ORAL | Status: DC | PRN

## 2015-08-11 MED ORDER — BENZOCAINE-MENTHOL 20-0.5 % EX AERO
1.0000 "application " | INHALATION_SPRAY | CUTANEOUS | Status: DC | PRN
  Administered 2015-08-11: 1 via TOPICAL
  Filled 2015-08-11 (×2): qty 56

## 2015-08-11 MED ORDER — COCONUT OIL OIL: 1.0000 "application " | TOPICAL_OIL | Status: DC | PRN

## 2015-08-11 MED ORDER — OXYTOCIN BOLUS FROM INFUSION: 500.0000 mL | INTRAVENOUS | Status: DC

## 2015-08-11 MED ORDER — METHYLERGONOVINE MALEATE 0.2 MG/ML IJ SOLN: 0.2000 mg | INTRAMUSCULAR | Status: DC | PRN

## 2015-08-11 MED ORDER — OXYTOCIN 40 UNITS IN LACTATED RINGERS INFUSION - SIMPLE MED: 2.5000 [IU]/h | INTRAVENOUS | Status: DC

## 2015-08-11 MED ORDER — DIPHENHYDRAMINE HCL 25 MG PO CAPS: 25.0000 mg | ORAL_CAPSULE | Freq: Four times a day (QID) | ORAL | Status: DC | PRN

## 2015-08-11 MED ORDER — LIDOCAINE HCL (PF) 1 % IJ SOLN
30.0000 mL | INTRAMUSCULAR | Status: DC | PRN
  Filled 2015-08-11: qty 30

## 2015-08-11 MED ORDER — SIMETHICONE 80 MG PO CHEW: 80.0000 mg | CHEWABLE_TABLET | ORAL | Status: DC | PRN

## 2015-08-11 MED ORDER — MEASLES, MUMPS & RUBELLA VAC ~~LOC~~ INJ
0.5000 mL | INJECTION | Freq: Once | SUBCUTANEOUS | Status: DC
  Filled 2015-08-11: qty 0.5

## 2015-08-11 MED ORDER — FLEET ENEMA 7-19 GM/118ML RE ENEM: 1.0000 | ENEMA | Freq: Every day | RECTAL | Status: DC | PRN

## 2015-08-11 MED ORDER — SENNOSIDES-DOCUSATE SODIUM 8.6-50 MG PO TABS
2.0000 | ORAL_TABLET | ORAL | Status: DC
  Administered 2015-08-12: 2 via ORAL
  Filled 2015-08-11: qty 2

## 2015-08-11 MED ORDER — PRENATAL MULTIVITAMIN CH
1.0000 | ORAL_TABLET | Freq: Every day | ORAL | Status: DC
  Administered 2015-08-11: 1 via ORAL
  Filled 2015-08-11: qty 1

## 2015-08-11 MED ORDER — FERROUS SULFATE 325 (65 FE) MG PO TABS
325.0000 mg | ORAL_TABLET | Freq: Two times a day (BID) | ORAL | Status: DC
  Administered 2015-08-11 – 2015-08-12 (×3): 325 mg via ORAL
  Filled 2015-08-11 (×3): qty 1

## 2015-08-11 MED ORDER — OXYTOCIN 40 UNITS IN LACTATED RINGERS INFUSION - SIMPLE MED: 2.5000 [IU]/h | INTRAVENOUS | Status: DC | PRN

## 2015-08-11 MED ORDER — POLYSACCHARIDE IRON COMPLEX 150 MG PO CAPS
150.0000 mg | ORAL_CAPSULE | Freq: Every day | ORAL | Status: DC
  Administered 2015-08-12: 150 mg via ORAL
  Filled 2015-08-11: qty 1

## 2015-08-11 MED ORDER — IBUPROFEN 600 MG PO TABS
600.0000 mg | ORAL_TABLET | Freq: Four times a day (QID) | ORAL | Status: DC
  Administered 2015-08-11 – 2015-08-12 (×6): 600 mg via ORAL
  Filled 2015-08-11 (×7): qty 1

## 2015-08-11 MED ORDER — ONDANSETRON HCL 4 MG/2ML IJ SOLN: 4.0000 mg | Freq: Four times a day (QID) | INTRAMUSCULAR | Status: DC | PRN

## 2015-08-11 MED ORDER — TETANUS-DIPHTH-ACELL PERTUSSIS 5-2.5-18.5 LF-MCG/0.5 IM SUSP: 0.5000 mL | Freq: Once | INTRAMUSCULAR | Status: DC

## 2015-08-11 NOTE — Progress Notes (Signed)
Post Partum Day #0 Subjective: no complaints, up ad lib and voiding. Breastfeeding.  Infant in nursery for low temperature.   Objective: Blood pressure 117/54, pulse 97, temperature 98.2 F (36.8 C), temperature source Oral, resp. rate 18, height 5\' 5"  (1.651 m), weight 181 lb (82.101 kg), last menstrual period 11/09/2014, SpO2 98 %, unknown if currently breastfeeding.  Physical Exam:  General: alert, cooperative and no distress Lochia: appropriate Uterine Fundus: firm Incision: no significant drainage DVT Evaluation: No evidence of DVT seen on physical exam. No cords or calf tenderness. No significant calf/ankle edema.   Recent Labs  08/10/15 1603  HGB 8.2*  HCT 27.2*    Assessment/Plan: Plan for discharge tomorrow, Breastfeeding and Lactation consult.  Anemia: on iron.    LOS: 1 day   Jillian Holden 08/11/2015, 8:22 AM

## 2015-08-11 NOTE — Lactation Note (Signed)
This note was copied from a baby's chart. Lactation Consultation Note  Patient Name: Girl Delsa Bernlyssa Racine FAOZH'YToday's Date: 08/11/2015 Reason for consult: Initial assessment Initial visit made.  Breastfeeding consultation services and support information given and reviewed.  This is mom's first baby.  Baby is 6111 hours old.  Baby is currently breastfeeding in football hold and nursing actively.  Reviewed basics and encouraged to feed with any feeding cue.  Instructed to call for assist prn. Maternal Data    Feeding Feeding Type: Breast Fed Length of feed: 20 min  LATCH Score/Interventions Latch: Grasps breast easily, tongue down, lips flanged, rhythmical sucking. Intervention(s): Breast massage  Audible Swallowing: Spontaneous and intermittent Intervention(s): Skin to skin;Hand expression;Alternate breast massage  Type of Nipple: Everted at rest and after stimulation  Comfort (Breast/Nipple): Soft / non-tender     Hold (Positioning): No assistance needed to correctly position infant at breast. Intervention(s): Breastfeeding basics reviewed;Support Pillows;Position options;Skin to skin  LATCH Score: 10  Lactation Tools Discussed/Used     Consult Status Consult Status: Follow-up Date: 08/12/15 Follow-up type: In-patient    Huston FoleyMOULDEN, Alvia Tory S 08/11/2015, 12:01 PM

## 2015-08-12 LAB — CBC
HEMATOCRIT: 24 % — AB (ref 36.0–46.0)
Hemoglobin: 7.3 g/dL — ABNORMAL LOW (ref 12.0–15.0)
MCH: 23.2 pg — ABNORMAL LOW (ref 26.0–34.0)
MCHC: 30.4 g/dL (ref 30.0–36.0)
MCV: 76.4 fL — ABNORMAL LOW (ref 78.0–100.0)
PLATELETS: 174 10*3/uL (ref 150–400)
RBC: 3.14 MIL/uL — ABNORMAL LOW (ref 3.87–5.11)
RDW: 18 % — AB (ref 11.5–15.5)
WBC: 11.7 10*3/uL — AB (ref 4.0–10.5)

## 2015-08-12 MED ORDER — OXYCODONE-ACETAMINOPHEN 5-325 MG PO TABS: 1.0000 | ORAL_TABLET | ORAL | Status: DC | PRN

## 2015-08-12 MED ORDER — COCONUT OIL OIL: 1.0000 "application " | TOPICAL_OIL | Status: DC | PRN

## 2015-08-12 MED ORDER — POLYSACCHARIDE IRON COMPLEX 150 MG PO CAPS: 150.0000 mg | ORAL_CAPSULE | Freq: Two times a day (BID) | ORAL | Status: DC

## 2015-08-12 MED ORDER — FERROUS SULFATE 325 (65 FE) MG PO TABS: 325.0000 mg | ORAL_TABLET | Freq: Three times a day (TID) | ORAL | Status: DC

## 2015-08-12 MED ORDER — BENZOCAINE-MENTHOL 20-0.5 % EX AERO: 1.0000 "application " | INHALATION_SPRAY | CUTANEOUS | Status: DC | PRN

## 2015-08-12 MED ORDER — IBUPROFEN 600 MG PO TABS: 600.0000 mg | ORAL_TABLET | Freq: Four times a day (QID) | ORAL | Status: DC | PRN

## 2015-08-12 MED ORDER — SENNOSIDES-DOCUSATE SODIUM 8.6-50 MG PO TABS: 2.0000 | ORAL_TABLET | Freq: Two times a day (BID) | ORAL | Status: DC

## 2015-08-12 NOTE — Discharge Summary (Signed)
Obstetric Discharge Summary Reason for Admission: onset of labor Prenatal Procedures: ultrasound Intrapartum Procedures: spontaneous vaginal delivery Postpartum Procedures: none Complications-Operative and Postpartum: 2nd degree perineal laceration HEMOGLOBIN  Date Value Ref Range Status  08/12/2015 7.3* 12.0 - 15.0 g/dL Final   HCT  Date Value Ref Range Status  08/12/2015 24.0* 36.0 - 46.0 % Final   HEMATOCRIT  Date Value Ref Range Status  05/27/2015 32.0* 34.0 - 46.6 % Final    Physical Exam:  General: alert, cooperative and no distress Lochia: appropriate Uterine Fundus: firm Incision: no significant drainage DVT Evaluation: No evidence of DVT seen on physical exam. No cords or calf tenderness. No significant calf/ankle edema.  Discharge Diagnoses: Term Pregnancy-delivered  Discharge Information: Date: 08/12/2015 Activity: pelvic rest Diet: routine Medications: PNV, Ibuprofen, Colace, Iron and Percocet Condition: stable Instructions: refer to practice specific booklet Discharge to: home Follow-up Information    [facility] Schedule an appointment as soon as possible for a visit in 2 weeks.   Why:  Postpartum exam   Contact information:   [address] [place] [id number] [phone]      Newborn Data: Live born female  Birth Weight: 6 lb 5.6 oz (2880 g) APGAR: 8, 9  Home with mother.  Roe Coombsachelle A Denney, CNM 08/12/2015, 9:22 AM

## 2015-08-12 NOTE — Discharge Instructions (Signed)

## 2015-08-12 NOTE — Progress Notes (Signed)
Post Partum Day #1 Subjective: no complaints, up ad lib, voiding and tolerating PO .  Desires D/C home.    Objective: Blood pressure 95/65, pulse 86, temperature 98.3 F (36.8 C), temperature source Oral, resp. rate 19, height 5\' 5"  (1.651 m), weight 181 lb (82.101 kg), last menstrual period 11/09/2014, SpO2 98 %, unknown if currently breastfeeding.  Physical Exam:  General: alert, cooperative and no distress Lochia: appropriate Uterine Fundus: firm Incision: no significant drainage DVT Evaluation: No evidence of DVT seen on physical exam. No cords or calf tenderness. No significant calf/ankle edema.   Recent Labs  08/10/15 1603 08/12/15 0505  HGB 8.2* 7.3*  HCT 27.2* 24.0*    Assessment/Plan: Discharge home and Breastfeeding.     LOS: 2 days   Roe CoombsRachelle A Denney, CNM 08/12/2015, 9:16 AM

## 2015-08-12 NOTE — Lactation Note (Signed)
This note was copied from a baby's chart. Lactation Consultation Note: mom ready for DC. Reports baby is latching well with no pain. Asking about using lanolin. Suggested EBM to nipples after nursing or coconut oil. Reviewed our phone number to ca;; with questions/concerns.   Patient Name: Jillian Holden YNWGN'FToday's Date: 08/12/2015     Maternal Data    Feeding    LATCH Score/Interventions                      Lactation Tools Discussed/Used     Consult Status      Pamelia HoitWeeks, [name] D 08/12/2015, 11:37 AM

## 2015-08-12 NOTE — Progress Notes (Signed)
Assumed care of mom and baby.  Mom and dad just laid baby down, after nursing. 

## 2015-08-17 ENCOUNTER — Encounter: Payer: BLUE CROSS/BLUE SHIELD | Admitting: Certified Nurse Midwife

## 2015-08-30 ENCOUNTER — Ambulatory Visit (INDEPENDENT_AMBULATORY_CARE_PROVIDER_SITE_OTHER): Payer: BLUE CROSS/BLUE SHIELD | Admitting: Certified Nurse Midwife

## 2015-08-30 ENCOUNTER — Encounter: Payer: Self-pay | Admitting: Certified Nurse Midwife

## 2015-08-30 LAB — POCT URINALYSIS DIPSTICK
Bilirubin, UA: NEGATIVE
Glucose, UA: NEGATIVE
Ketones, UA: NEGATIVE
Nitrite, UA: NEGATIVE
PROTEIN UA: NEGATIVE
RBC UA: NEGATIVE
SPEC GRAV UA: 1.01
UROBILINOGEN UA: 0.2
pH, UA: 5

## 2015-08-30 NOTE — Progress Notes (Signed)
Subjective:     Jillian Holden is a 30 y.o. female who presents for a postpartum visit. She is 2 weeks postpartum following a spontaneous vaginal delivery. I have fully reviewed the prenatal and intrapartum course. The delivery was at 40 gestational weeks. Outcome: spontaneous vaginal delivery. Anesthesia: nitrous oxide. Postpartum course has been normal. Baby's course has been normal. Baby is feeding by breast. Bleeding staining only. Bowel function is normal. Bladder function is normal. Patient is not sexually active. Contraception method is abstinence. Postpartum depression screening: negative.  Tobacco, alcohol and substance abuse history reviewed.  Adult immunizations reviewed including TDAP, rubella and varicella.  The following portions of the patient's history were reviewed and updated as appropriate: allergies, current medications, past family history, past medical history, past social history, past surgical history and problem list.  Review of Systems Pertinent items noted in HPI and remainder of comprehensive ROS otherwise negative.   Objective:    BP 134/78   Pulse 80   Temp 98.3 F (36.8 C) (Oral)   Wt 157 lb 6.4 oz (71.4 kg)   BMI 26.19 kg/m   General:  alert, cooperative and no distress   Breasts:  inspection negative, no nipple discharge or bleeding, no masses or nodularity palpable  Lungs: clear to auscultation bilaterally  Heart:  regular rate and rhythm, S1, S2 normal, no murmur, click, rub or gallop  Abdomen: soft, non-tender; bowel sounds normal; no masses,  no organomegaly   Vulva:  not evaluated  Vagina: not evaluated  Cervix:  not evaluated  Corpus: not examined  Adnexa:  not evaluated  Rectal Exam: Not performed.          50% of 15 min visit spent on counseling and coordination of care.  Assessment:     Normal 2 week postpartum exam. Pap smear not done at today's visit.     H/O bipolar disorder: not on medications currently Plan:    1. Contraception:  abstinence 2.  Planning condoms for contraception.  Is doing well.  3. Follow up in: 4 weeks or as needed.   4. CBC with 6 week postpartum visit, consider starting antidepressants at that visit.  2hr GTT for h/o GDM/screening for DM q 3 yrs per ADA recommendations Preconception counseling provided Healthy lifestyle practices reviewed

## 2015-09-27 ENCOUNTER — Encounter: Payer: Self-pay | Admitting: Certified Nurse Midwife

## 2015-09-27 ENCOUNTER — Ambulatory Visit (INDEPENDENT_AMBULATORY_CARE_PROVIDER_SITE_OTHER): Payer: BLUE CROSS/BLUE SHIELD | Admitting: Certified Nurse Midwife

## 2015-09-27 DIAGNOSIS — Z8742 Personal history of other diseases of the female genital tract: Secondary | ICD-10-CM

## 2015-09-27 DIAGNOSIS — Z01419 Encounter for gynecological examination (general) (routine) without abnormal findings: Secondary | ICD-10-CM | POA: Diagnosis not present

## 2015-09-27 NOTE — Progress Notes (Signed)
Subjective:        Jillian Holden is a 30 y.o. female here for a routine exam.  Current complaints: none, has started her first period since delivery.  Is currently breast feeding.    Declines STD screening.   Personal health questionnaire:  At any time, has a partner hit, kicked or otherwise hurt or frightened you?: no Over the past 2 weeks, have you felt down, depressed or hopeless?: no Over the past 2 weeks, have you felt little interest or pleasure in doing things?:no   Gynecologic History Patient's last menstrual period was 09/26/2015. Contraception: abstinence, condoms and rhythm method Last Pap: 12/2014. Results were: ASCUS with +HPV Last mammogram: N/A.   Obstetric History OB History  Gravida Para Term Preterm AB Living  1 1 1  0 0 1  SAB TAB Ectopic Multiple Live Births  0 0 0 0 1    # Outcome Date GA Lbr Len/2nd Weight Sex Delivery Anes PTL Lv  1 Term 08/11/15 745w2d 09:33 / 01:52 6 lb 5.6 oz (2.88 kg) F Vag-Spont Other  LIV      Past Medical History:  Diagnosis Date  . Anxiety   . Bipolar 1 disorder (HCC)   . Bipolar 2 disorder (HCC)   . Insomnia   . PTSD (post-traumatic stress disorder)     Past Surgical History:  Procedure Laterality Date  . NO PAST SURGERIES       Current Outpatient Prescriptions:  .  ferrous sulfate 325 (65 FE) MG tablet, Take 1 tablet (325 mg total) by mouth 3 (three) times daily with meals., Disp: 120 tablet, Rfl: 3 .  iron polysaccharides (NIFEREX) 150 MG capsule, Take 1 capsule (150 mg total) by mouth 2 (two) times daily., Disp: 60 capsule, Rfl: 4 .  Prenatal Vit-Fe Fumarate-FA (PRENATAL MULTIVITAMIN) TABS tablet, Take 1 tablet by mouth daily at 12 noon., Disp: , Rfl:  .  senna-docusate (SENOKOT-S) 8.6-50 MG tablet, Take 2 tablets by mouth 2 (two) times daily., Disp: 90 tablet, Rfl: PRN Allergies  Allergen Reactions  . Ambien [Zolpidem Tartrate] Other (See Comments)    Hallucination     Social History  Substance Use  Topics  . Smoking status: Never Smoker  . Smokeless tobacco: Never Used  . Alcohol use No     Comment: Socially    Family History  Problem Relation Age of Onset  . Hyperlipidemia Father   . Hypertension Father   . Varicose Veins Maternal Grandmother   . Varicose Veins Paternal Grandmother   . Hyperlipidemia Paternal Grandfather       Review of Systems  Constitutional: negative for fatigue and weight loss Respiratory: negative for cough and wheezing Cardiovascular: negative for chest pain, fatigue and palpitations Gastrointestinal: negative for abdominal pain and change in bowel habits Musculoskeletal:negative for myalgias Neurological: negative for gait problems and tremors Behavioral/Psych: negative for abusive relationship, depression Endocrine: negative for temperature intolerance   Genitourinary:negative for abnormal menstrual periods, genital lesions, hot flashes, sexual problems and vaginal discharge Integument/breast: negative for breast lump, breast tenderness, nipple discharge and skin lesion(s)    Objective:       BP 119/85   Pulse 93   Temp 99.3 F (37.4 C)   Wt 155 lb 3.2 oz (70.4 kg)   LMP 09/26/2015   Breastfeeding? Yes   BMI 25.83 kg/m  General:   alert  Skin:   no rash or abnormalities  Lungs:   clear to auscultation bilaterally  Heart:   regular rate and  rhythm, S1, S2 normal, no murmur, click, rub or gallop  Breasts:   normal without suspicious masses, skin or nipple changes or axillary nodes  Abdomen:  normal findings: no organomegaly, soft, non-tender and no hernia  Pelvis:  External genitalia: normal general appearance Urinary system: urethral meatus normal and bladder without fullness, nontender Vaginal: normal without tenderness, induration or masses Cervix: normal appearance Adnexa: normal bimanual exam Uterus: anteverted and non-tender, normal size   Lab Review Urine pregnancy test Labs reviewed yes Radiologic studies reviewed  no  50% of 30 min visit spent on counseling and coordination of care.   Assessment:    Healthy female exam.   Declines STD screening  H/O abnormal pap smear, H/O HPV  Breast feeding status  Normal 6 week postpartum exam  Plan:    Education reviewed: calcium supplements, depression evaluation, low fat, low cholesterol diet, safe sex/STD prevention, self breast exams, skin cancer screening and weight bearing exercise. Contraception: abstinence, condoms and rhythm method. Follow up in: 1 year.   No orders of the defined types were placed in this encounter.  No orders of the defined types were placed in this encounter.

## 2015-09-30 LAB — PAP IG W/ RFLX HPV ASCU: PAP Smear Comment: 0

## 2015-10-06 ENCOUNTER — Telehealth: Payer: Self-pay | Admitting: *Deleted

## 2015-10-06 NOTE — Telephone Encounter (Signed)
Patient made aware of abnormal PAP result and need for a Colposcopy.Sent call to front for this procedure to be scheduled.

## 2015-11-01 ENCOUNTER — Encounter: Payer: Self-pay | Admitting: Obstetrics

## 2015-11-07 ENCOUNTER — Other Ambulatory Visit (HOSPITAL_COMMUNITY)
Admission: RE | Admit: 2015-11-07 | Discharge: 2015-11-07 | Disposition: A | Discharge status: Home / Self Care | Payer: BLUE CROSS/BLUE SHIELD | Source: Ambulatory Visit | Attending: Obstetrics | Admitting: Obstetrics

## 2015-11-07 ENCOUNTER — Ambulatory Visit (INDEPENDENT_AMBULATORY_CARE_PROVIDER_SITE_OTHER): Payer: BLUE CROSS/BLUE SHIELD | Admitting: Obstetrics and Gynecology

## 2015-11-07 DIAGNOSIS — N870 Mild cervical dysplasia: Secondary | ICD-10-CM | POA: Insufficient documentation

## 2015-11-07 DIAGNOSIS — R87612 Low grade squamous intraepithelial lesion on cytologic smear of cervix (LGSIL): Secondary | ICD-10-CM | POA: Diagnosis not present

## 2015-11-07 NOTE — Progress Notes (Signed)
Patient given informed consent, signed copy in the chart, time out was performed.  Placed in lithotomy position. Cervix viewed with speculum and colposcope after application of acetic acid.   Colposcopy adequate?  yes Acetowhite lesions? yes at 1 o'clock Punctation? no Mosaicism?  no Abnormal vasculature?  no Biopsies? 1 o'clock ECC? yes  COMMENTS:  Patient was given post procedure instructions.  She will return in 2 weeks for results.  Catalina AntiguaONSTANT,Kaziah Krizek, MD

## 2015-11-10 ENCOUNTER — Telehealth: Payer: Self-pay | Admitting: *Deleted

## 2015-11-10 NOTE — Telephone Encounter (Signed)
Called patient to inform her of colpo results consistent with pap smear. patient needs to return in 1 year for pap smear with co-testing.  Patient will schedule repeat pap in one year.

## 2017-05-16 ENCOUNTER — Encounter: Payer: Self-pay | Admitting: Certified Nurse Midwife

## 2017-05-16 ENCOUNTER — Ambulatory Visit (INDEPENDENT_AMBULATORY_CARE_PROVIDER_SITE_OTHER): Payer: BLUE CROSS/BLUE SHIELD | Admitting: Certified Nurse Midwife

## 2017-05-16 VITALS — BP 115/76 | HR 87 | Resp 16 | Wt 144.2 lb

## 2017-05-16 DIAGNOSIS — Z113 Encounter for screening for infections with a predominantly sexual mode of transmission: Secondary | ICD-10-CM | POA: Diagnosis not present

## 2017-05-16 DIAGNOSIS — Z842 Family history of other diseases of the genitourinary system: Secondary | ICD-10-CM

## 2017-05-16 DIAGNOSIS — Z01419 Encounter for gynecological examination (general) (routine) without abnormal findings: Secondary | ICD-10-CM

## 2017-05-16 DIAGNOSIS — Z124 Encounter for screening for malignant neoplasm of cervix: Secondary | ICD-10-CM | POA: Diagnosis not present

## 2017-05-16 DIAGNOSIS — N926 Irregular menstruation, unspecified: Secondary | ICD-10-CM

## 2017-05-16 DIAGNOSIS — Z1151 Encounter for screening for human papillomavirus (HPV): Secondary | ICD-10-CM

## 2017-05-16 NOTE — Progress Notes (Signed)
Subjective:        Jillian Holden is a 32 y.o. female here for a routine exam.  Current complaints: reports normal periods since delivery 18 +mo ago, hx of irregular periods prior to pregnancy. Reports family history of PCOS, cousins.  Declines STD testing.     Personal health questionnaire:  Is patient Ashkenazi Jewish, have a family history of breast and/or ovarian cancer: no Is there a family history of uterine cancer diagnosed at age < 4850, gastrointestinal cancer, urinary tract cancer, family member who is a Personnel officerLynch syndrome-associated carrier: no Is the patient overweight and hypertensive, family history of diabetes, personal history of gestational diabetes, preeclampsia or PCOS: no Is patient over 2855, have PCOS,  family history of premature CHD under age 30, diabetes, smoke, have hypertension or peripheral artery disease:  no At any time, has a partner hit, kicked or otherwise hurt or frightened you?: no Over the past 2 weeks, have you felt down, depressed or hopeless?: no Over the past 2 weeks, have you felt little interest or pleasure in doing things?:not asked   Gynecologic History Patient's last menstrual period was 04/25/2017. Contraception: condoms Last Pap: 09/27/15. Results were: LSIL, with confirmatory colposcopy Last mammogram: n/a <40 years, no significant family history.  Obstetric History OB History  Gravida Para Term Preterm AB Living  1 1 1  0 0 1  SAB TAB Ectopic Multiple Live Births  0 0 0 0 1    # Outcome Date GA Lbr Len/2nd Weight Sex Delivery Anes PTL Lv  1 Term 08/11/15 5920w2d 09:33 / 01:52 6 lb 5.6 oz (2.88 kg) F Vag-Spont Other  LIV    Past Medical History:  Diagnosis Date  . Anxiety   . Bipolar 1 disorder (HCC)   . Bipolar 2 disorder (HCC)   . Insomnia   . PTSD (post-traumatic stress disorder)     Past Surgical History:  Procedure Laterality Date  . NO PAST SURGERIES       Current Outpatient Medications:  Marland Kitchen.  Multiple Vitamin  (MULTIVITAMIN WITH MINERALS) TABS tablet, Take 1 tablet by mouth daily., Disp: , Rfl:  Allergies  Allergen Reactions  . Ambien [Zolpidem Tartrate] Other (See Comments)    Hallucination     Social History   Tobacco Use  . Smoking status: Never Smoker  . Smokeless tobacco: Never Used  Substance Use Topics  . Alcohol use: No    Alcohol/week: 0.0 oz    Comment: Socially    Family History  Problem Relation Age of Onset  . Hyperlipidemia Father   . Hypertension Father   . Varicose Veins Maternal Grandmother   . Varicose Veins Paternal Grandmother   . Hyperlipidemia Paternal Grandfather       Review of Systems  Constitutional: negative for fatigue and weight loss Respiratory: negative for cough and wheezing Cardiovascular: negative for chest pain, fatigue and palpitations Gastrointestinal: negative for abdominal pain and change in bowel habits Musculoskeletal:negative for myalgias Neurological: negative for gait problems and tremors Behavioral/Psych: negative for abusive relationship, depression Endocrine: negative for temperature intolerance    Genitourinary:negative for abnormal menstrual periods, genital lesions, hot flashes, sexual problems and vaginal discharge Integument/breast: negative for breast lump, breast tenderness, nipple discharge and skin lesion(s), + hirsutism    Objective:       BP 115/76 (BP Location: Right Arm, Patient Position: Sitting, Cuff Size: Large)   Pulse 87   Resp 16   Wt 144 lb 3.2 oz (65.4 kg)   LMP 04/25/2017  Breastfeeding? No   BMI 24.00 kg/m  General:   alert  Skin:   no rash or abnormalities, +hirsutism on chest/abdomen  Lungs:   clear to auscultation bilaterally  Heart:   regular rate and rhythm, S1, S2 normal, no murmur, click, rub or gallop  Breasts:   normal without suspicious masses, skin or nipple changes or axillary nodes  Abdomen:  normal findings: no organomegaly, soft, non-tender and no hernia  Pelvis:  External  genitalia: normal general appearance Urinary system: urethral meatus normal and bladder without fullness, nontender Vaginal: normal without tenderness, induration or masses Cervix: normal appearance Adnexa: normal bimanual exam Uterus: anteverted and non-tender, normal size   Lab Review Urine pregnancy test Labs reviewed yes Radiologic studies reviewed no  50% of 30 min visit spent on counseling and coordination of care.   Assessment & Plan    Healthy female exam.    1. Irregular periods    - TSH - Prolactin - Testosterone, Free, Total, SHBG - 17-Hydroxyprogesterone - Progesterone - Hemoglobin A1c - US PELVIC COMPLETE WITH TRANSVAGINAL; Future  2. Family history of PCOS    - TSH - Prolactin - Testosterone, Free, Total, SHBG - 17-Hydroxyprogesterone - Progesterone - Hemoglobin A1c - US PELVIC COMPLETE WITH TRANSVAGINAL; Future  3. Screening for cervical cancer    - Cytology - PAP - Cervicovaginal ancillary only   Education reviewed: calcium supplements, depression evaluation, low fat, low cholesterol diet, safe sex/STD prevention, self breast exams, skin cancer screening and weight bearing exercise. Follow up in: 1 month.   No orders of the defined types were placed in this encounter.  Orders Placed This Encounter  Procedures  . US PELVIC COMPLETE WITH TRANSVAGINAL    Epic order Ins- bcbs Wt-144/no needs mb w/aneetra@ofc     Standing Status:   Future    Standing Expiration Date:   07/17/2018    Order Specific Question:   Reason for Exam (SYMPTOM  OR DIAGNOSIS REQUIRED)    Answer:   irregular periods, r/o PCOS    Order Specific Question:   Preferred imaging location?    Answer:   [facility]  . TSH  . Prolactin  . Testosterone, Free, Total, SHBG  . 17-Hydroxyprogesterone  . Progesterone  . Hemoglobin A1c   Possible management options include:  Metformin/Spironolactone Follow up after [place]

## 2017-05-17 LAB — CERVICOVAGINAL ANCILLARY ONLY
BACTERIAL VAGINITIS: NEGATIVE
Candida vaginitis: NEGATIVE
Chlamydia: NEGATIVE
NEISSERIA GONORRHEA: NEGATIVE
TRICH (WINDOWPATH): NEGATIVE

## 2017-05-20 ENCOUNTER — Other Ambulatory Visit: Payer: Self-pay | Admitting: Certified Nurse Midwife

## 2017-05-20 ENCOUNTER — Telehealth: Payer: Self-pay | Admitting: *Deleted

## 2017-05-20 LAB — PROLACTIN: Prolactin: 11.6 ng/mL (ref 4.8–23.3)

## 2017-05-20 LAB — TESTOSTERONE, FREE, TOTAL, SHBG
SEX HORMONE BINDING: 38.1 nmol/L (ref 24.6–122.0)
TESTOSTERONE FREE: 1.6 pg/mL (ref 0.0–4.2)
Testosterone: 25 ng/dL (ref 8–48)

## 2017-05-20 LAB — PROGESTERONE: Progesterone: 10.2 ng/mL

## 2017-05-20 LAB — HEMOGLOBIN A1C
Est. average glucose Bld gHb Est-mCnc: 94 mg/dL
Hgb A1c MFr Bld: 4.9 % (ref 4.8–5.6)

## 2017-05-20 LAB — TSH: TSH: 1.26 u[IU]/mL (ref 0.450–4.500)

## 2017-05-20 LAB — 17-HYDROXYPROGESTERONE: 17-Hydroxyprogesterone: 165 ng/dL

## 2017-05-20 NOTE — Telephone Encounter (Signed)
Please let her know that I am waiting for her lab results and ultrasound before prescribing the medications for PCOS.  Thank you Boykin Reaperachelle

## 2017-05-20 NOTE — Telephone Encounter (Signed)
Both pt and pharmacy have called to office stating a Rx was to be sent in for pt last week.  No Rx was sent, no name of drug given.    Please advise or send Rx as needed.

## 2017-05-22 LAB — CYTOLOGY - PAP
ADEQUACY: ABSENT
Diagnosis: NEGATIVE
HPV: NOT DETECTED

## 2017-05-23 ENCOUNTER — Ambulatory Visit
Admission: RE | Admit: 2017-05-23 | Discharge: 2017-05-23 | Disposition: A | Discharge status: Home / Self Care | Payer: BLUE CROSS/BLUE SHIELD | Source: Ambulatory Visit | Attending: Certified Nurse Midwife | Admitting: Certified Nurse Midwife

## 2017-05-23 DIAGNOSIS — N926 Irregular menstruation, unspecified: Secondary | ICD-10-CM

## 2017-05-23 DIAGNOSIS — Z842 Family history of other diseases of the genitourinary system: Secondary | ICD-10-CM

## 2017-06-13 ENCOUNTER — Encounter: Payer: Self-pay | Admitting: Certified Nurse Midwife

## 2017-06-13 ENCOUNTER — Ambulatory Visit (INDEPENDENT_AMBULATORY_CARE_PROVIDER_SITE_OTHER): Payer: BLUE CROSS/BLUE SHIELD | Admitting: Certified Nurse Midwife

## 2017-06-13 VITALS — BP 115/76 | HR 75 | Wt 142.0 lb

## 2017-06-13 DIAGNOSIS — Z3009 Encounter for other general counseling and advice on contraception: Secondary | ICD-10-CM

## 2017-06-13 DIAGNOSIS — Z09 Encounter for follow-up examination after completed treatment for conditions other than malignant neoplasm: Secondary | ICD-10-CM

## 2017-06-13 NOTE — Progress Notes (Signed)
Patient ID: Jillian Holden, female   DOB: Sep 22, 1985, 32 y.o.   MRN: [id number]  Chief Complaint  Patient presents with  . Follow-up    iud check, has had pain on lower right side    HPI Jillian Holden is a 32 y.o. female.  Here for f/u on lab work and recent ultrasound.  Had normal blood work and pelvic US.  Results discussed.  Does have hx of Bipolar disorder and anxiety: controlled currently without medications.  Reports being very happy.  May want another pregnancy in about another year. Declines contraception currently: using condoms/NFP methods.  Reports Mittelschmerz type pain a few days ago, roughly day 16 after her last LMP. Did not take any medication for the pain but states that it was worse than usual.    LMP 05/26/17, reports normal period bleeding.     HPI  Past Medical History:  Diagnosis Date  . Anxiety   . Bipolar 1 disorder (HCC)   . Bipolar 2 disorder (HCC)   . Insomnia   . PTSD (post-traumatic stress disorder)     Past Surgical History:  Procedure Laterality Date  . NO PAST SURGERIES      Family History  Problem Relation Age of Onset  . Hyperlipidemia Father   . Hypertension Father   . Varicose Veins Maternal Grandmother   . Varicose Veins Paternal Grandmother   . Hyperlipidemia Paternal Grandfather     Social History Social History   Tobacco Use  . Smoking status: Never Smoker  . Smokeless tobacco: Never Used  Substance Use Topics  . Alcohol use: No    Alcohol/week: 0.0 oz    Comment: Socially  . Drug use: No    Allergies  Allergen Reactions  . Ambien [Zolpidem Tartrate] Other (See Comments)    Hallucination     Current Outpatient Medications  Medication Sig Dispense Refill  . Multiple Vitamin (MULTIVITAMIN WITH MINERALS) TABS tablet Take 1 tablet by mouth daily.     No current facility-administered medications for this visit.     Review of Systems Review of Systems Constitutional: negative for fatigue and weight  loss Respiratory: negative for cough and wheezing Cardiovascular: negative for chest pain, fatigue and palpitations Gastrointestinal: negative for abdominal pain and change in bowel habits Genitourinary:negative Integument/breast: negative for nipple discharge Musculoskeletal:negative for myalgias Neurological: negative for gait problems and tremors Behavioral/Psych: negative for abusive relationship, depression Endocrine: negative for temperature intolerance      Blood pressure 115/76, pulse 75, weight 142 lb (64.4 kg), not currently breastfeeding.  Physical Exam Physical Exam General:   alert  PE Deferred   100% of 15 min visit spent on counseling and coordination of care.   Data Reviewed Previous medical hx, meds, labs  Assessment     Hx of irregular periods Family hx of PCOS Normal pelvic US and lab work Contraception counseling     Plan ?Nuva Ring possible in the future.  Declines IUD Follow up as needed.

## 2018-09-22 ENCOUNTER — Other Ambulatory Visit: Payer: Self-pay

## 2018-09-22 ENCOUNTER — Ambulatory Visit (INDEPENDENT_AMBULATORY_CARE_PROVIDER_SITE_OTHER): Payer: BC Managed Care – PPO

## 2018-09-22 VITALS — BP 127/87 | HR 92 | Temp 98.9°F | Ht 65.0 in | Wt 147.6 lb

## 2018-09-22 DIAGNOSIS — Z3201 Encounter for pregnancy test, result positive: Secondary | ICD-10-CM | POA: Diagnosis not present

## 2018-09-22 DIAGNOSIS — Z3480 Encounter for supervision of other normal pregnancy, unspecified trimester: Secondary | ICD-10-CM

## 2018-09-22 LAB — POCT URINE PREGNANCY: Preg Test, Ur: POSITIVE — AB

## 2018-09-22 NOTE — Progress Notes (Signed)
Ms. Noell presents today for UPT. She has no unusual complaints. LMP: 08/18/2018    OBJECTIVE: Appears well, in no apparent distress.  OB History    Gravida  2   Para  1   Term  1   Preterm  0   AB  0   Living  1     SAB  0   TAB  0   Ectopic  0   Multiple  0   Live Births  1          Home UPT Result: POSITIVE X 3 In-Office UPT result: POSITIVE  I have reviewed the patient's medical, obstetrical, social, and family histories, and medications.   ASSESSMENT: Positive pregnancy test  PLAN Prenatal care to be completed at: [facility] Activation Code sent. Vitafol Ultra Samples given  LOT  [id number] EXP  01/2019 [place]  [id number]

## 2018-09-23 MED ORDER — BLOOD PRESSURE MONITOR KIT: 1.0000 | PACK | 0 refills | Status: AC

## 2018-09-23 NOTE — Addendum Note (Signed)
Addended by: Tamela Oddi on: 09/23/2018 10:21 AM   Modules accepted: Orders

## 2018-10-28 ENCOUNTER — Other Ambulatory Visit: Payer: Self-pay

## 2018-10-28 ENCOUNTER — Encounter: Payer: Self-pay | Admitting: Certified Nurse Midwife

## 2018-10-28 ENCOUNTER — Other Ambulatory Visit: Payer: BC Managed Care – PPO

## 2018-10-28 ENCOUNTER — Ambulatory Visit (INDEPENDENT_AMBULATORY_CARE_PROVIDER_SITE_OTHER): Payer: BC Managed Care – PPO | Admitting: Certified Nurse Midwife

## 2018-10-28 DIAGNOSIS — Z3A10 10 weeks gestation of pregnancy: Secondary | ICD-10-CM | POA: Diagnosis not present

## 2018-10-28 DIAGNOSIS — Z3481 Encounter for supervision of other normal pregnancy, first trimester: Secondary | ICD-10-CM

## 2018-10-28 DIAGNOSIS — Z3480 Encounter for supervision of other normal pregnancy, unspecified trimester: Secondary | ICD-10-CM

## 2018-10-28 NOTE — Progress Notes (Signed)
Bedside U/S shows single IUP with FHT of 175 BPM and CRL measures 33.68mm  GA is [redacted]w[redacted]d

## 2018-10-28 NOTE — Patient Instructions (Signed)
Safe Medications in Pregnancy  ° °Acne: °Benzoyl Peroxide °Salicylic Acid ° °Backache/Headache: °Tylenol: 2 regular strength every 4 hours OR °             2 Extra strength every 6 hours ° °Colds/Coughs/Allergies: °Benadryl (alcohol free) 25 mg every 6 hours as needed °Breath right strips °Claritin °Cepacol throat lozenges °Chloraseptic throat spray °Cold-Eeze- up to three times per day °Cough drops, alcohol free °Flonase (by prescription only) °Guaifenesin °Mucinex °Robitussin DM (plain only, alcohol free) °Saline nasal spray/drops °Sudafed (pseudoephedrine) & Actifed ** use only after [redacted] weeks gestation and if you do not have high blood pressure °Tylenol °Vicks Vaporub °Zinc lozenges °Zyrtec  ° °Constipation: °Colace °Ducolax suppositories °Fleet enema °Glycerin suppositories °Metamucil °Milk of magnesia °Miralax °Senokot °Smooth move tea ° °Diarrhea: °Kaopectate °Imodium A-D ° °*NO pepto Bismol ° °Hemorrhoids: °Anusol °Anusol HC °Preparation H °Tucks ° °Indigestion: °Tums °Maalox °Mylanta °Zantac  °Pepcid ° °Insomnia: °Benadryl (alcohol free) 25mg every 6 hours as needed °Tylenol PM °Unisom, no Gelcaps ° °Leg Cramps: °Tums °MagGel ° °Nausea/Vomiting:  °Bonine °Dramamine °Emetrol °Ginger extract °Sea bands °Meclizine  °Nausea medication to take during pregnancy:  °Unisom (doxylamine succinate 25 mg tablets) Take one tablet daily at bedtime. If symptoms are not adequately controlled, the dose can be increased to a maximum recommended dose of two tablets daily (1/2 tablet in the morning, 1/2 tablet mid-afternoon and one at bedtime). °Vitamin B6 100mg tablets. Take one tablet twice a day (up to 200 mg per day). ° °Skin Rashes: °Aveeno products °Benadryl cream or 25mg every 6 hours as needed °Calamine Lotion °1% cortisone cream ° °Yeast infection: °Gyne-lotrimin 7 °Monistat 7 ° ° °**If taking multiple medications, please check labels to avoid duplicating the same active ingredients °**take medication as directed on  the label °** Do not exceed 4000 mg of tylenol in 24 hours °**Do not take medications that contain aspirin or ibuprofen ° ° ° ° °First Trimester of Pregnancy ° °The first trimester of pregnancy is from week 1 until the end of week 13 (months 1 through 3). During this time, your baby will begin to develop inside you. At 6-8 weeks, the eyes and face are formed, and the heartbeat can be seen on ultrasound. At the end of 12 weeks, all the baby's organs are formed. Prenatal care is all the medical care you receive before the birth of your baby. Make sure you get good prenatal care and follow all of your doctor's instructions. °Follow these instructions at home: °Medicines °· Take over-the-counter and prescription medicines only as told by your doctor. Some medicines are safe and some medicines are not safe during pregnancy. °· Take a prenatal vitamin that contains at least 600 micrograms (mcg) of folic acid. °· If you have trouble pooping (constipation), take medicine that will make your stool soft (stool softener) if your doctor approves. °Eating and drinking ° °· Eat regular, healthy meals. °· Your doctor will tell you the amount of weight gain that is right for you. °· Avoid raw meat and uncooked cheese. °· If you feel sick to your stomach (nauseous) or throw up (vomit): °? Eat 4 or 5 small meals a day instead of 3 large meals. °? Try eating a few soda crackers. °? Drink liquids between meals instead of during meals. °· To prevent constipation: °? Eat foods that are high in fiber, like fresh fruits and vegetables, whole grains, and beans. °? Drink enough fluids to keep your pee (urine) clear or pale yellow. °  Activity °· Exercise only as told by your doctor. Stop exercising if you have cramps or pain in your lower belly (abdomen) or low back. °· Do not exercise if it is too hot, too humid, or if you are in a place of great height (high altitude). °· Try to avoid standing for long periods of time. Move your legs often  if you must stand in one place for a long time. °· Avoid heavy lifting. °· Wear low-heeled shoes. Sit and stand up straight. °· You can have sex unless your doctor tells you not to. °Relieving pain and discomfort °· Wear a good support bra if your breasts are sore. °· Take warm water baths (sitz baths) to soothe pain or discomfort caused by hemorrhoids. Use hemorrhoid cream if your doctor says it is okay. °· Rest with your legs raised if you have leg cramps or low back pain. °· If you have puffy, bulging veins (varicose veins) in your legs: °? Wear support hose or compression stockings as told by your doctor. °? Raise (elevate) your feet for 15 minutes, 3-4 times a day. °? Limit salt in your food. °Prenatal care °· Schedule your prenatal visits by the twelfth week of pregnancy. °· Write down your questions. Take them to your prenatal visits. °· Keep all your prenatal visits as told by your doctor. This is important. °Safety °· Wear your seat belt at all times when driving. °· Make a list of emergency phone numbers. The list should include numbers for family, friends, the hospital, and police and fire departments. °General instructions °· Ask your doctor for a referral to a local prenatal class. Begin classes no later than at the start of month 6 of your pregnancy. °· Ask for help if you need counseling or if you need help with nutrition. Your doctor can give you advice or tell you where to go for help. °· Do not use hot tubs, steam rooms, or saunas. °· Do not douche or use tampons or scented sanitary pads. °· Do not cross your legs for long periods of time. °· Avoid all herbs and alcohol. Avoid drugs that are not approved by your doctor. °· Do not use any tobacco products, including cigarettes, chewing tobacco, and electronic cigarettes. If you need help quitting, ask your doctor. You may get counseling or other support to help you quit. °· Avoid cat litter boxes and soil used by cats. These carry germs that can  cause birth defects in the baby and can cause a loss of your baby (miscarriage) or stillbirth. °· Visit your dentist. At home, brush your teeth with a soft toothbrush. Be gentle when you floss. °Contact a doctor if: °· You are dizzy. °· You have mild cramps or pressure in your lower belly. °· You have a nagging pain in your belly area. °· You continue to feel sick to your stomach, you throw up, or you have watery poop (diarrhea). °· You have a bad smelling fluid coming from your vagina. °· You have pain when you pee (urinate). °· You have increased puffiness (swelling) in your face, hands, legs, or ankles. °Get help right away if: °· You have a fever. °· You are leaking fluid from your vagina. °· You have spotting or bleeding from your vagina. °· You have very bad belly cramping or pain. °· You gain or lose weight rapidly. °· You throw up blood. It may look like coffee grounds. °· You are around people who have German measles, fifth disease,   or chickenpox. °· You have a very bad headache. °· You have shortness of breath. °· You have any kind of trauma, such as from a fall or a car accident. °Summary °· The first trimester of pregnancy is from week 1 until the end of week 13 (months 1 through 3). °· To take care of yourself and your unborn baby, you will need to eat healthy meals, take medicines only if your doctor tells you to do so, and do activities that are safe for you and your baby. °· Keep all follow-up visits as told by your doctor. This is important as your doctor will have to ensure that your baby is healthy and growing well. °This information is not intended to replace advice given to you by your health care provider. Make sure you discuss any questions you have with your health care provider. °Document Released: 07/04/2007 Document Revised: 05/08/2018 Document Reviewed: 01/24/2016 °Elsevier Patient Education © 2020 [employer] ° °

## 2018-10-28 NOTE — Progress Notes (Addendum)
Subjective:   Jillian Holden is a 33 y.o. G2P1001 at 62w1dby LMP, confirmed by early ultrasound being seen today for her first obstetrical visit. Her obstetrical history is significant for low-risk pregnancy, term spontaneous vaginal delivery. This is a planned pregnancy. Patient does intend to breast feed. Pregnancy history fully reviewed.  Patient reports mild nausea. Is taking Unisom and vitamin B6, which is helping. She has no other complaints.   HISTORY: OB History  Gravida Para Term Preterm AB Living  2 1 1  0 0 1  SAB TAB Ectopic Multiple Live Births  0 0 0 0 1    # Outcome Date GA Lbr Len/2nd Weight Sex Delivery Anes PTL Lv  2 Current           1 Term 08/11/15 324w2d9:33 / 01:52 2880 g F Vag-Spont Other  LIV     Name: FOBecky [place]   Apgar1: 8  Apgar5: 9   Past Medical History:  Diagnosis Date  . Anemia   . Anxiety   . Bipolar 1 disorder (HCDeputy  . Bipolar 2 disorder (HCNaalehu  . Depression   . Headache   . Insomnia   . PTSD (post-traumatic stress disorder)   . Vaginal Pap smear, abnormal    Past Surgical History:  Procedure Laterality Date  . NO PAST SURGERIES     Family History  Problem Relation Age of Onset  . Hyperlipidemia Father   . Hypertension Father   . Varicose Veins Maternal Grandmother   . Varicose Veins Paternal Grandmother   . Diabetes Paternal Grandmother   . Hyperlipidemia Paternal Grandfather    Social History   Tobacco Use  . Smoking status: Never Smoker  . Smokeless tobacco: Never Used  Substance Use Topics  . Alcohol use: No    Alcohol/week: 0.0 standard drinks    Comment: Socially  . Drug use: No   Allergies  Allergen Reactions  . Ambien [Zolpidem Tartrate] Other (See Comments)    Hallucination    Current Outpatient Medications on File Prior to Visit  Medication Sig Dispense Refill  . doxylamine, Sleep, (UNISOM) 25 MG tablet Take 25 mg by mouth at bedtime as needed.    . Prenatal Vit-Fe Fumarate-FA  (MULTIVITAMIN-PRENATAL) 27-0.8 MG TABS tablet Take 1 tablet by mouth daily at 12 noon.    . Blood Pressure Monitor KIT 1 kit by Does not apply route once a week. CHECK BP WEEKLY. LARGE CUFF  DX: O90.0 (Patient not taking: Reported on 10/28/2018) 1 kit 0  . calcium gluconate 500 MG tablet Take 1 tablet by mouth daily.    . Multiple Vitamin (MULTIVITAMIN WITH MINERALS) TABS tablet Take 1 tablet by mouth daily.    . Marland Kitchenmega-3 acid ethyl esters (LOVAZA) 1 g capsule Take by mouth daily.     No current facility-administered medications on file prior to visit.     Indications for ASA therapy (per uptodate) One of the following: Previous pregnancy with preeclampsia, especially early onset and with an adverse outcome No Multifetal gestation No Chronic hypertension No Type 1 or 2 diabetes mellitus No Chronic kidney disease No Autoimmune disease (antiphospholipid syndrome, systemic lupus erythematosus) No  Two or more of the following: Nulliparity No Obesity (body mass index >30 kg/m2) No Family history of preeclampsia in mother or sister No Age ?35 years No Sociodemographic characteristics (African American race, low socioeconomic level) No Personal risk factors (eg, previous pregnancy with low birth weight or small for gestational  age infant, previous adverse pregnancy outcome [eg, stillbirth], interval >10 years between pregnancies) No  Indications for early 1 hour GTT (per uptodate)  BMI >25 (>23 in Asian women) AND one of the following  Gestational diabetes mellitus in a previous pregnancy No Glycated hemoglobin ?5.7 percent (39 mmol/mol), impaired glucose tolerance, or impaired fasting glucose on previous testing No First-degree relative with diabetes No High-risk race/ethnicity (eg, African American, Latino, Native American, [place] American, Pacific Islander) No History of cardiovascular disease No Hypertension or on therapy for hypertension No High-density lipoprotein cholesterol level  <35 mg/dL (0.90 mmol/L) and/or a triglyceride level >250 mg/dL (2.82 mmol/L) No Polycystic ovary syndrome No Physical inactivity No Other clinical condition associated with insulin resistance (eg, severe obesity, acanthosis nigricans) No Previous birth of an infant weighing ?4000 g No Previous stillbirth of unknown cause No   Exam   Vitals:   10/28/18 0824  BP: 110/75  Pulse: 85  Weight: 68.5 kg   Fetal Heart Rate (bpm): 175  System: General: well-developed, well-nourished female in no acute distress   Breast:  normal appearance, no masses or tenderness   Skin: normal coloration and turgor, no rashes   Neurologic: oriented, normal, negative, normal mood   Extremities: normal strength, tone, and muscle mass, ROM of all joints is normal   HEENT PERRLA, extraocular movement intact and sclera clear, anicteric   Mouth/Teeth mucous membranes moist, pharynx normal without lesions and dental hygiene good   Neck supple and no masses   Cardiovascular: regular rate and rhythm   Respiratory:  no respiratory distress, normal breath sounds   Abdomen: soft, non-tender; bowel sounds normal; no masses,  no organomegaly     Assessment:   Pregnancy: G2P1001 Patient Active Problem List   Diagnosis Date Noted  . Supervision of other normal pregnancy, antepartum 09/22/2018  . Low grade squamous intraepith lesion on cytologic smear cervix (lgsil) 11/07/2015     Plan:  1. Supervision of other normal pregnancy, antepartum - New OB appointment - Pt and husband are very excited! - Pt prefers midwives for care - Mild nausea controlled with Unisom and vitamin B6.  - Discussed importance of frequent small meals and snacks high in protein to help with appetite and nausea - Interested in genetic screening - New OB labs today - Call the office if any questions or concerns arise - Anticipatory guidance for upcoming appointments and visits    - Obstetric panel - HIV antibody (with reflex) -  Cervicovaginal ancillary only( ) - [place] bedside; Future - Babyscripts Schedule Optimization - Culture, OB Urine   Initial labs drawn. Early 1 hour GTT: no Started on ASA: no Flu vax: not done Continue prenatal vitamins. Genetic Screening discussed, NIPS: requested. Ultrasound discussed; fetal anatomic survey: ordered. Problem list reviewed and updated The nature of [place] for [facility] with multiple MDs and other Advanced Practice Providers was explained to patient; also emphasized that fellows, residents, and students are part of our team. Routine obstetric precautions reviewed Return in about 4 weeks (around 11/25/2018) for ROB-mychart.   Renee Harder, SNM 9:12 AM 10/28/18

## 2018-10-28 NOTE — Progress Notes (Signed)
Pt would like to do genetic testing 

## 2018-10-29 LAB — OBSTETRIC PANEL
Absolute Monocytes: 737 cells/uL (ref 200–950)
Antibody Screen: NOT DETECTED
Basophils Absolute: 39 cells/uL (ref 0–200)
Basophils Relative: 0.4 %
Eosinophils Absolute: 126 cells/uL (ref 15–500)
Eosinophils Relative: 1.3 %
HCT: 38.5 % (ref 35.0–45.0)
Hemoglobin: 12.7 g/dL (ref 11.7–15.5)
Hepatitis B Surface Ag: NONREACTIVE
Lymphs Abs: 1601 cells/uL (ref 850–3900)
MCH: 30.5 pg (ref 27.0–33.0)
MCHC: 33 g/dL (ref 32.0–36.0)
MCV: 92.3 fL (ref 80.0–100.0)
MPV: 10.6 fL (ref 7.5–12.5)
Monocytes Relative: 7.6 %
Neutro Abs: 7197 cells/uL (ref 1500–7800)
Neutrophils Relative %: 74.2 %
Platelets: 269 10*3/uL (ref 140–400)
RBC: 4.17 10*6/uL (ref 3.80–5.10)
RDW: 12.4 % (ref 11.0–15.0)
RPR Ser Ql: NONREACTIVE
Rubella: 1.88 index
Total Lymphocyte: 16.5 %
WBC: 9.7 10*3/uL (ref 3.8–10.8)

## 2018-10-29 LAB — HIV ANTIBODY (ROUTINE TESTING W REFLEX): HIV 1&2 Ab, 4th Generation: NONREACTIVE

## 2018-10-30 LAB — CULTURE, OB URINE

## 2018-10-30 LAB — URINE CULTURE, OB REFLEX

## 2018-11-03 ENCOUNTER — Encounter: Payer: BC Managed Care – PPO | Admitting: Advanced Practice Midwife

## 2018-11-05 ENCOUNTER — Telehealth: Payer: Self-pay

## 2018-11-05 DIAGNOSIS — O219 Vomiting of pregnancy, unspecified: Secondary | ICD-10-CM

## 2018-11-05 MED ORDER — DOXYLAMINE-PYRIDOXINE 10-10 MG PO TBEC: DELAYED_RELEASE_TABLET | ORAL | 2 refills | Status: DC

## 2018-11-05 NOTE — Telephone Encounter (Signed)
Pt called needing Rx for Diclegis due to nausea and vomiting in pregnancy. Rx sent.

## 2018-11-11 ENCOUNTER — Other Ambulatory Visit: Payer: Self-pay

## 2018-11-11 ENCOUNTER — Ambulatory Visit (INDEPENDENT_AMBULATORY_CARE_PROVIDER_SITE_OTHER): Payer: BC Managed Care – PPO | Admitting: *Deleted

## 2018-11-11 DIAGNOSIS — Z113 Encounter for screening for infections with a predominantly sexual mode of transmission: Secondary | ICD-10-CM

## 2018-11-11 DIAGNOSIS — Z3480 Encounter for supervision of other normal pregnancy, unspecified trimester: Secondary | ICD-10-CM | POA: Diagnosis not present

## 2018-11-11 NOTE — Addendum Note (Signed)
Addended by: Asencion Islam on: 11/11/2018 01:36 PM   Modules accepted: Orders

## 2018-11-11 NOTE — Progress Notes (Signed)
Pt here for Nipts and GC/Chlamydia culture only

## 2018-11-14 LAB — URINE CYTOLOGY ANCILLARY ONLY
Chlamydia: NEGATIVE
Comment: NEGATIVE
Comment: NORMAL
Neisseria Gonorrhea: NEGATIVE

## 2018-11-17 DIAGNOSIS — Z3480 Encounter for supervision of other normal pregnancy, unspecified trimester: Secondary | ICD-10-CM

## 2018-11-25 ENCOUNTER — Encounter: Payer: Self-pay | Admitting: Obstetrics and Gynecology

## 2018-11-25 ENCOUNTER — Telehealth (INDEPENDENT_AMBULATORY_CARE_PROVIDER_SITE_OTHER): Payer: BC Managed Care – PPO | Admitting: Obstetrics and Gynecology

## 2018-11-25 DIAGNOSIS — Z3482 Encounter for supervision of other normal pregnancy, second trimester: Secondary | ICD-10-CM

## 2018-11-25 DIAGNOSIS — Z3480 Encounter for supervision of other normal pregnancy, unspecified trimester: Secondary | ICD-10-CM

## 2018-11-25 DIAGNOSIS — Z3A14 14 weeks gestation of pregnancy: Secondary | ICD-10-CM

## 2018-11-25 MED ORDER — METOCLOPRAMIDE HCL 10 MG PO TABS: 10.0000 mg | ORAL_TABLET | Freq: Three times a day (TID) | ORAL | 1 refills | Status: DC

## 2018-11-25 MED ORDER — DOXYLAMINE-PYRIDOXINE 10-10 MG PO TBEC: 2.0000 | DELAYED_RELEASE_TABLET | Freq: Every evening | ORAL | 1 refills | Status: DC | PRN

## 2018-11-25 MED ORDER — ONDANSETRON 8 MG PO TBDP: 8.0000 mg | ORAL_TABLET | Freq: Three times a day (TID) | ORAL | 1 refills | Status: DC | PRN

## 2018-11-25 NOTE — Patient Instructions (Signed)
Most private insurance covers for circumcision in the hospital. You may have a deductible depending on your coverage. I would call [employer] to just check exactly what your cost would be if any.   Thanks,  [employer]

## 2018-11-25 NOTE — Progress Notes (Signed)
   TELEHEALTH VIRTUAL OBSTETRICS VISIT ENCOUNTER NOTE  I connected with Jillian Holden on 11/25/18 at  9:30 AM EDT by telephone at home and verified that I am speaking with the correct person using two identifiers.   I discussed the limitations, risks, security and privacy concerns of performing an evaluation and management service by telephone and the availability of in person appointments. I also discussed with the patient that there may be a patient responsible charge related to this service. The patient expressed understanding and agreed to proceed.  Subjective:  Jillian Holden is a 33 y.o. G2P1001 at [redacted]w[redacted]d being followed for ongoing prenatal care.  She is currently monitored for the following issues for this low-risk pregnancy and has Low grade squamous intraepith lesion on cytologic smear cervix (lgsil) and Supervision of other normal pregnancy, antepartum on their problem list.  Patient reports no complaints. Reports fetal movement. Denies any contractions, bleeding or leaking of fluid.   The following portions of the patient's history were reviewed and updated as appropriate: allergies, current medications, past family history, past medical history, past social history, past surgical history and problem list.   Objective:   General:  Alert, oriented and cooperative.   Mental Status: Normal mood and affect perceived. Normal judgment and thought content.  Rest of physical exam deferred due to type of encounter  Assessment and Plan:  Pregnancy: G2P1001 at [redacted]w[redacted]d 1. Supervision of other normal pregnancy, antepartum  Never received her BP cuff. She plans to purchase one on her own Having some N/V. Was not able to fill Diclegis d/t  Prior authorization. Attempted to RX again, along with Reglan and Zofran.  Virtual visits ok, Unsure about having AFP drawn. Will check with her husband and let us know.   Preterm labor symptoms and general obstetric precautions including but not  limited to vaginal bleeding, contractions, leaking of fluid and fetal movement were reviewed in detail with the patient.  I discussed the assessment and treatment plan with the patient. The patient was provided an opportunity to ask questions and all were answered. The patient agreed with the plan and demonstrated an understanding of the instructions. The patient was advised to call back or seek an in-person office evaluation/go to [facility]U at [facility] for any urgent or concerning symptoms. Please refer to After Visit Summary for other counseling recommendations.   I provided 11 minutes of non-face-to-face time during this encounter.  No follow-ups on file.  Future Appointments  Date Time Provider [place]  12/30/2018  9:30 AM [facility] [place], NP Center for [facility], [place]

## 2018-11-25 NOTE — Progress Notes (Signed)
PT never received BP monitor. I spoke with pt and she is going to go purchase one

## 2018-12-23 ENCOUNTER — Encounter: Payer: Self-pay | Admitting: Certified Nurse Midwife

## 2018-12-23 ENCOUNTER — Telehealth (INDEPENDENT_AMBULATORY_CARE_PROVIDER_SITE_OTHER): Payer: BC Managed Care – PPO | Admitting: Certified Nurse Midwife

## 2018-12-23 DIAGNOSIS — Z3480 Encounter for supervision of other normal pregnancy, unspecified trimester: Secondary | ICD-10-CM

## 2018-12-23 DIAGNOSIS — Z3482 Encounter for supervision of other normal pregnancy, second trimester: Secondary | ICD-10-CM

## 2018-12-23 DIAGNOSIS — Z3A18 18 weeks gestation of pregnancy: Secondary | ICD-10-CM

## 2018-12-23 NOTE — Progress Notes (Signed)
   TELEHEALTH OBSTETRICS PRENATAL VIRTUAL VIDEO VISIT ENCOUNTER NOTE  Provider location: [facility] for [place] at [place]   I connected with Jillian Holden on 12/23/18 at  9:10 AM EST by MyChart Video Encounter at home and verified that I am speaking with the correct person using two identifiers.   I discussed the limitations, risks, security and privacy concerns of performing an evaluation and management service virtually and the availability of in person appointments. I also discussed with the patient that there may be a patient responsible charge related to this service. The patient expressed understanding and agreed to proceed. Subjective:  Jillian Holden is a 33 y.o. G2P1001 at [redacted]w[redacted]d being seen today for ongoing prenatal care.  She is currently monitored for the following issues for this low-risk pregnancy and has Low grade squamous intraepith lesion on cytologic smear cervix (lgsil) and Supervision of other normal pregnancy, antepartum on their problem list.  Patient reports nausea.  Contractions: Not present.  .  Movement: Present. Denies any leaking of fluid.   The following portions of the patient's history were reviewed and updated as appropriate: allergies, current medications, past family history, past medical history, past social history, past surgical history and problem list.   Objective:   Vitals:   12/23/18 0902  BP: 100/70    Fetal Status:     Movement: Present     General:  Alert, oriented and cooperative. Patient is in no acute distress.  Respiratory: Normal respiratory effort, no problems with respiration noted  Mental Status: Normal mood and affect. Normal behavior. Normal judgment and thought content.  Rest of physical exam deferred due to type of encounter  Imaging: No results found.  Assessment and Plan:  Pregnancy: G2P1001 at [redacted]w[redacted]d 1. Supervision of other normal pregnancy, antepartum - Patient doing well, reports occasional nausea but  reports that is getting better with the zofran she takes 1-2 times per week, reports energy is coming back.  - Routine prenatal care  - Anticipatory guidance on upcoming appointments including GTT around 28 weeks, patient is concerned about GTT d/t becoming very sick after test in last pregnancy and becoming nauseous with sugary foods during this pregnancy- wants to discuss other options.  - Discussed option of 2hr vs 1hr GTT, using jelly beans instead of glucola or monitoring CBGs 4x day for 3 weeks as testing options- encouraged patient to think about options and discuss plan at next appointment    Preterm labor symptoms and general obstetric precautions including but not limited to vaginal bleeding, contractions, leaking of fluid and fetal movement were reviewed in detail with the patient. I discussed the assessment and treatment plan with the patient. The patient was provided an opportunity to ask questions and all were answered. The patient agreed with the plan and demonstrated an understanding of the instructions. The patient was advised to call back or seek an in-person office evaluation/go to MAU at [facility] for any urgent or concerning symptoms. Please refer to After Visit Summary for other counseling recommendations.   I provided 13 minutes of face-to-face time during this encounter.  Return in about 6 weeks (around 02/03/2019) for ROB-virtual.  Future Appointments  Date Time Provider [place]  12/30/2018  9:30 AM [facility] [place] 1 [facility]  02/03/2019  9:10 AM Jillian Holden, CNM [facility]    Jillian Holden, [place] for [facility], [place]

## 2018-12-30 ENCOUNTER — Other Ambulatory Visit: Payer: Self-pay

## 2018-12-30 ENCOUNTER — Ambulatory Visit (HOSPITAL_COMMUNITY)
Admission: RE | Admit: 2018-12-30 | Discharge: 2018-12-30 | Disposition: A | Discharge status: Home / Self Care | Payer: BC Managed Care – PPO | Source: Ambulatory Visit | Attending: Obstetrics and Gynecology | Admitting: Obstetrics and Gynecology

## 2018-12-30 ENCOUNTER — Other Ambulatory Visit: Payer: Self-pay | Admitting: Certified Nurse Midwife

## 2018-12-30 ENCOUNTER — Other Ambulatory Visit (HOSPITAL_COMMUNITY): Payer: Self-pay | Admitting: *Deleted

## 2018-12-30 DIAGNOSIS — Z3480 Encounter for supervision of other normal pregnancy, unspecified trimester: Secondary | ICD-10-CM | POA: Insufficient documentation

## 2018-12-30 DIAGNOSIS — O350XX0 Maternal care for (suspected) central nervous system malformation in fetus, not applicable or unspecified: Secondary | ICD-10-CM

## 2018-12-30 DIAGNOSIS — Z3A19 19 weeks gestation of pregnancy: Secondary | ICD-10-CM

## 2018-12-30 DIAGNOSIS — O358XX0 Maternal care for other (suspected) fetal abnormality and damage, not applicable or unspecified: Secondary | ICD-10-CM

## 2018-12-30 DIAGNOSIS — O3503X0 Maternal care for (suspected) central nervous system malformation or damage in fetus, choroid plexus cysts, not applicable or unspecified: Secondary | ICD-10-CM

## 2018-12-31 ENCOUNTER — Encounter: Payer: Self-pay | Admitting: Certified Nurse Midwife

## 2018-12-31 DIAGNOSIS — IMO0002 Reserved for concepts with insufficient information to code with codable children: Secondary | ICD-10-CM | POA: Insufficient documentation

## 2018-12-31 DIAGNOSIS — O350XX0 Maternal care for (suspected) central nervous system malformation in fetus, not applicable or unspecified: Secondary | ICD-10-CM | POA: Insufficient documentation

## 2019-01-30 NOTE — L&D Delivery Note (Signed)
Patient is a 34 y.o. now G2P2 s/p NSVD at [redacted]w[redacted]d, who was admitted for IOL for decreased fetal movement.  She progressed with augmentation to complete and pushed 1.5 hours to deliver.  Cord clamping delayed by several minutes then clamped by CNM and cut by FOB "Trey Paula".  Placenta intact and spontaneous, bleeding minimal.  1st degree laceration repaired without difficulty.  Mom and baby stable prior to transfer to postpartum. She plans on breastfeeding. Husband is getting vasectomy   Delivery Note At 7:25 PM a viable and healthy female "Jillian Holden" was delivered via Vaginal, Spontaneous (Presentation: Right Occiput Posterior).  APGAR: 9, 9; weight pending.   Placenta status: Spontaneous, Intact.  Cord: 3 vessels with the following complications: None.   Anesthesia: None Episiotomy: None Lacerations: 1st degree;Vaginal Suture Repair: 3.0 vicryl Est. Blood Loss (mL): 110  Mom to postpartum.  Baby to Couplet care / Skin to Skin.  Sharyon Cable CNM 05/25/2019, 8:05 PM

## 2019-02-03 ENCOUNTER — Telehealth (INDEPENDENT_AMBULATORY_CARE_PROVIDER_SITE_OTHER): Payer: BC Managed Care – PPO | Admitting: Certified Nurse Midwife

## 2019-02-03 ENCOUNTER — Ambulatory Visit (HOSPITAL_COMMUNITY): Payer: BC Managed Care – PPO | Admitting: *Deleted

## 2019-02-03 ENCOUNTER — Ambulatory Visit (HOSPITAL_COMMUNITY)
Admission: RE | Admit: 2019-02-03 | Discharge: 2019-02-03 | Disposition: A | Discharge status: Home / Self Care | Payer: BC Managed Care – PPO | Source: Ambulatory Visit | Attending: Obstetrics and Gynecology | Admitting: Obstetrics and Gynecology

## 2019-02-03 ENCOUNTER — Encounter (HOSPITAL_COMMUNITY): Payer: Self-pay

## 2019-02-03 ENCOUNTER — Encounter: Payer: Self-pay | Admitting: Certified Nurse Midwife

## 2019-02-03 ENCOUNTER — Other Ambulatory Visit: Payer: Self-pay

## 2019-02-03 DIAGNOSIS — Z3480 Encounter for supervision of other normal pregnancy, unspecified trimester: Secondary | ICD-10-CM

## 2019-02-03 DIAGNOSIS — O358XX0 Maternal care for other (suspected) fetal abnormality and damage, not applicable or unspecified: Secondary | ICD-10-CM

## 2019-02-03 DIAGNOSIS — O350XX0 Maternal care for (suspected) central nervous system malformation in fetus, not applicable or unspecified: Secondary | ICD-10-CM | POA: Diagnosis present

## 2019-02-03 DIAGNOSIS — Z362 Encounter for other antenatal screening follow-up: Secondary | ICD-10-CM | POA: Diagnosis not present

## 2019-02-03 DIAGNOSIS — Z3A24 24 weeks gestation of pregnancy: Secondary | ICD-10-CM

## 2019-02-03 DIAGNOSIS — IMO0002 Reserved for concepts with insufficient information to code with codable children: Secondary | ICD-10-CM

## 2019-02-03 DIAGNOSIS — O3503X0 Maternal care for (suspected) central nervous system malformation or damage in fetus, choroid plexus cysts, not applicable or unspecified: Secondary | ICD-10-CM

## 2019-02-03 NOTE — Progress Notes (Signed)
   TELEHEALTH OBSTETRICS PRENATAL VIRTUAL VIDEO VISIT ENCOUNTER NOTE  Provider location: [facility] for [facility] [place]   I connected with Jillian Holden on 02/03/19 at  9:10 AM EST by MyChart Video Encounter at home and verified that I am speaking with the correct person using two identifiers.   I discussed the limitations, risks, security and privacy concerns of performing an evaluation and management service virtually and the availability of in person appointments. I also discussed with the patient that there may be a patient responsible charge related to this service. The patient expressed understanding and agreed to proceed. Subjective:  Jillian Holden is a 34 y.o. G2P1001 at [redacted]w[redacted]d being seen today for ongoing prenatal care.  She is currently monitored for the following issues for this low-risk pregnancy and has Low grade squamous intraepith lesion on cytologic smear cervix (lgsil); Supervision of other normal pregnancy, antepartum; and Choroid plexus cyst of fetus on prenatal ultrasound on their problem list.  Patient reports no complaints.  Contractions: Not present.  .  Movement: Present. Denies any leaking of fluid.   The following portions of the patient's history were reviewed and updated as appropriate: allergies, current medications, past family history, past medical history, past social history, past surgical history and problem list.   Objective:  There were no vitals filed for this visit. - patient reports she will take BP tonight and enter into babyscripts   Fetal Status:     Movement: Present     General:  Alert, oriented and cooperative. Patient is in no acute distress.  Respiratory: Normal respiratory effort, no problems with respiration noted  Mental Status: Normal mood and affect. Normal behavior. Normal judgment and thought content.  Rest of physical exam deferred due to type of encounter  Imaging: No results found.  Assessment and Plan:    Pregnancy: G2P1001 at [redacted]w[redacted]d 1. Choroid plexus cyst of fetus on prenatal ultrasound - F/u US today   2. Supervision of other normal pregnancy, antepartum - Patient doing well, reports occasional back pain and pelvic pressure  - Educated and discussed maternity support belt and ordering off of [employer] or at [employer], patient plans to look into it  - Routine prenatal care - Anticipatory guidance on upcoming appointments with GTT at next appointment, encouraged to fast after MN prior to appointment - patient verbalizes understanding    Preterm labor symptoms and general obstetric precautions including but not limited to vaginal bleeding, contractions, leaking of fluid and fetal movement were reviewed in detail with the patient. I discussed the assessment and treatment plan with the patient. The patient was provided an opportunity to ask questions and all were answered. The patient agreed with the plan and demonstrated an understanding of the instructions. The patient was advised to call back or seek an in-person office evaluation/go to MAU at [facility] for any urgent or concerning symptoms. Please refer to After Visit Summary for other counseling recommendations.   I provided 8 minutes of face-to-face time during this encounter.  Return in about 4 weeks (around 03/03/2019) for ROB/GTT.  Future Appointments  Date Time Provider Department Center  02/03/2019 10:15 AM [facility] [place] 4 WH-MFCUS MFC-US  02/03/2019 10:20 AM WH-MFC NURSE [facility]-MFC MFC-US  03/03/2019  8:15 AM Sharyon Cable, CNM [facility]    Sharyon Cable, CNM Center for [employer], [facility]

## 2019-02-24 ENCOUNTER — Encounter: Payer: Self-pay | Admitting: Certified Nurse Midwife

## 2019-03-03 ENCOUNTER — Other Ambulatory Visit: Payer: Self-pay

## 2019-03-03 ENCOUNTER — Ambulatory Visit (INDEPENDENT_AMBULATORY_CARE_PROVIDER_SITE_OTHER): Payer: BC Managed Care – PPO | Admitting: Certified Nurse Midwife

## 2019-03-03 ENCOUNTER — Encounter: Payer: Self-pay | Admitting: Certified Nurse Midwife

## 2019-03-03 VITALS — BP 106/58 | HR 92 | Temp 98.4°F | Wt 166.0 lb

## 2019-03-03 DIAGNOSIS — IMO0002 Reserved for concepts with insufficient information to code with codable children: Secondary | ICD-10-CM

## 2019-03-03 DIAGNOSIS — Z3A28 28 weeks gestation of pregnancy: Secondary | ICD-10-CM

## 2019-03-03 DIAGNOSIS — Z23 Encounter for immunization: Secondary | ICD-10-CM

## 2019-03-03 DIAGNOSIS — O350XX0 Maternal care for (suspected) central nervous system malformation in fetus, not applicable or unspecified: Secondary | ICD-10-CM

## 2019-03-03 DIAGNOSIS — Z3480 Encounter for supervision of other normal pregnancy, unspecified trimester: Secondary | ICD-10-CM

## 2019-03-03 NOTE — Patient Instructions (Signed)

## 2019-03-03 NOTE — Progress Notes (Signed)
   PRENATAL VISIT NOTE  Subjective:  Jillian Holden is a 34 y.o. G2P1001 at [redacted]w[redacted]d being seen today for ongoing prenatal care.  She is currently monitored for the following issues for this low-risk pregnancy and has Low grade squamous intraepith lesion on cytologic smear cervix (lgsil); Supervision of other normal pregnancy, antepartum; and Choroid plexus cyst of fetus on prenatal ultrasound on their problem list.  Patient reports no complaints.  Contractions: Not present. Vag. Bleeding: None.  Movement: Present. Denies leaking of fluid.   The following portions of the patient's history were reviewed and updated as appropriate: allergies, current medications, past family history, past medical history, past social history, past surgical history and problem list.   Objective:   Vitals:   03/03/19 0810  BP: (!) 106/58  Pulse: 92  Temp: 98.4 F (36.9 C)  Weight: 166 lb (75.3 kg)    Fetal Status: Fetal Heart Rate (bpm): 135 Fundal Height: 30 cm Movement: Present     General:  Alert, oriented and cooperative. Patient is in no acute distress.  Skin: Skin is warm and dry. No rash noted.   Cardiovascular: Normal heart rate noted  Respiratory: Normal respiratory effort, no problems with respiration noted  Abdomen: Soft, gravid, appropriate for gestational age.  Pain/Pressure: Absent     Pelvic: Cervical exam deferred        Extremities: Normal range of motion.  Edema: None  Mental Status: Normal mood and affect. Normal behavior. Normal judgment and thought content.   Assessment and Plan:  Pregnancy: G2P1001 at [redacted]w[redacted]d 1. Supervision of other normal pregnancy, antepartum - patient doing well, no complaints - Patient reports she is energized and excited about pregnancy, has started to nest  - Reviewed breast pump in the mail  - Anticipatory guidance on upcoming appointments - Educated and discussed GTT and what abnormal results mean  - Routine prenatal care - Discussed weight gain in  pregnancy  - 2Hr GTT w/ 1 Hr Carpenter 75 g - CBC - HIV antibody (with reflex) - RPR  2. Choroid plexus cyst of fetus on prenatal ultrasound - [place] on 1/5 notes choroid plexus cyst is resolved and no longer seen on [place]   Preterm labor symptoms and general obstetric precautions including but not limited to vaginal bleeding, contractions, leaking of fluid and fetal movement were reviewed in detail with the patient. Please refer to After Visit Summary for other counseling recommendations.   Return in about 4 weeks (around 03/31/2019) for ROB-mychart.  Future Appointments  Date Time Provider Department Center  03/31/2019  9:15 AM Sharyon Cable, CNM [facility]    Sharyon Cable, CNM

## 2019-03-04 LAB — 2HR GTT W 1 HR, CARPENTER, 75 G
Glucose, 1 Hr, Gest: 165 mg/dL (ref 65–179)
Glucose, 2 Hr, Gest: 146 mg/dL (ref 65–152)
Glucose, Fasting, Gest: 77 mg/dL (ref 65–91)

## 2019-03-04 LAB — CBC
HCT: 30.8 % — ABNORMAL LOW (ref 35.0–45.0)
Hemoglobin: 10 g/dL — ABNORMAL LOW (ref 11.7–15.5)
MCH: 27.5 pg (ref 27.0–33.0)
MCHC: 32.5 g/dL (ref 32.0–36.0)
MCV: 84.8 fL (ref 80.0–100.0)
MPV: 11.1 fL (ref 7.5–12.5)
Platelets: 241 10*3/uL (ref 140–400)
RBC: 3.63 10*6/uL — ABNORMAL LOW (ref 3.80–5.10)
RDW: 12.7 % (ref 11.0–15.0)
WBC: 9.3 10*3/uL (ref 3.8–10.8)

## 2019-03-04 LAB — RPR: RPR Ser Ql: NONREACTIVE

## 2019-03-04 LAB — HIV ANTIBODY (ROUTINE TESTING W REFLEX): HIV 1&2 Ab, 4th Generation: NONREACTIVE

## 2019-03-05 ENCOUNTER — Encounter: Payer: Self-pay | Admitting: Certified Nurse Midwife

## 2019-03-05 ENCOUNTER — Other Ambulatory Visit: Payer: Self-pay | Admitting: Certified Nurse Midwife

## 2019-03-05 DIAGNOSIS — O99013 Anemia complicating pregnancy, third trimester: Secondary | ICD-10-CM

## 2019-03-05 DIAGNOSIS — O99019 Anemia complicating pregnancy, unspecified trimester: Secondary | ICD-10-CM | POA: Insufficient documentation

## 2019-03-05 MED ORDER — FERROUS SULFATE 325 (65 FE) MG PO TABS: 325.0000 mg | ORAL_TABLET | Freq: Two times a day (BID) | ORAL | 1 refills | Status: DC

## 2019-03-12 ENCOUNTER — Encounter: Payer: Self-pay | Admitting: Certified Nurse Midwife

## 2019-03-12 ENCOUNTER — Other Ambulatory Visit: Payer: Self-pay | Admitting: Certified Nurse Midwife

## 2019-03-12 DIAGNOSIS — B370 Candidal stomatitis: Secondary | ICD-10-CM

## 2019-03-12 MED ORDER — NYSTATIN 100000 UNIT/ML MT SUSP: 5.0000 mL | Freq: Four times a day (QID) | OROMUCOSAL | 0 refills | Status: DC

## 2019-03-12 NOTE — Progress Notes (Signed)
Patient called with concern of oral thrush. Reviewed symptoms with patient - pt reports cottony feeling in mouth, pain during eating, film over tongue.   Rx for treatment sent to pharmacy on file.    Steward Drone CNM

## 2019-03-31 ENCOUNTER — Telehealth (INDEPENDENT_AMBULATORY_CARE_PROVIDER_SITE_OTHER): Payer: BC Managed Care – PPO | Admitting: Certified Nurse Midwife

## 2019-03-31 DIAGNOSIS — Z3A32 32 weeks gestation of pregnancy: Secondary | ICD-10-CM

## 2019-03-31 DIAGNOSIS — O99013 Anemia complicating pregnancy, third trimester: Secondary | ICD-10-CM

## 2019-03-31 DIAGNOSIS — Z3480 Encounter for supervision of other normal pregnancy, unspecified trimester: Secondary | ICD-10-CM

## 2019-03-31 DIAGNOSIS — O350XX0 Maternal care for (suspected) central nervous system malformation in fetus, not applicable or unspecified: Secondary | ICD-10-CM

## 2019-03-31 DIAGNOSIS — IMO0002 Reserved for concepts with insufficient information to code with codable children: Secondary | ICD-10-CM

## 2019-03-31 NOTE — Progress Notes (Signed)
   TELEHEALTH OBSTETRICS PRENATAL VIRTUAL VIDEO VISIT ENCOUNTER NOTE  Provider location: [facility] for [facility] [place]   I connected with Jillian Holden on 03/31/19 at  9:20 AM EST by MyChart Video Encounter at home and verified that I am speaking with the correct person using two identifiers.   I discussed the limitations, risks, security and privacy concerns of performing an evaluation and management service virtually and the availability of in person appointments. I also discussed with the patient that there may be a patient responsible charge related to this service. The patient expressed understanding and agreed to proceed. Subjective:  Jillian Holden is a 34 y.o. G2P1001 at [redacted]w[redacted]d being seen today for ongoing prenatal care.  She is currently monitored for the following issues for this low-risk pregnancy and has Low grade squamous intraepith lesion on cytologic smear cervix (lgsil); Supervision of other normal pregnancy, antepartum; Choroid plexus cyst of fetus on prenatal ultrasound; and Anemia of mother in pregnancy on their problem list.  Patient reports no complaints.  Contractions: Not present. Vag. Bleeding: None.  Movement: Present. Denies any leaking of fluid.   The following portions of the patient's history were reviewed and updated as appropriate: allergies, current medications, past family history, past medical history, past social history, past surgical history and problem list.   Objective:   Vitals:   03/31/19 0905  BP: 113/63    Fetal Status:     Movement: Present     General:  Alert, oriented and cooperative. Patient is in no acute distress.  Respiratory: Normal respiratory effort, no problems with respiration noted  Mental Status: Normal mood and affect. Normal behavior. Normal judgment and thought content.  Rest of physical exam deferred due to type of encounter  Imaging: No results found.  Assessment and Plan:  Pregnancy: G2P1001 at  [redacted]w[redacted]d 1. Anemia during pregnancy in third trimester - Patient currently taking iron supplementation daily  - recheck Hgb around 36 weeks  - Patient reports increasing fiber in diet to prevent constipation   2. Choroid plexus cyst of fetus on prenatal ultrasound - resolved based on last [place]   3. Supervision of other normal pregnancy, antepartum - patient doing well, no complaints - Routine prenatal care - Anticipatory guidance on upcoming appointments including GBS at next appointment - Reviewed with patient use of RRT and when to start use    Preterm labor symptoms and general obstetric precautions including but not limited to vaginal bleeding, contractions, leaking of fluid and fetal movement were reviewed in detail with the patient. I discussed the assessment and treatment plan with the patient. The patient was provided an opportunity to ask questions and all were answered. The patient agreed with the plan and demonstrated an understanding of the instructions. The patient was advised to call back or seek an in-person office evaluation/go to MAU at [facility] for any urgent or concerning symptoms. Please refer to After Visit Summary for other counseling recommendations.   I provided 10 minutes of face-to-face time during this encounter.  Return in about 4 weeks (around 04/28/2019) for ROB/GBS.  Future Appointments  Date Time Provider Department Center  04/28/2019  8:15 AM Sharyon Cable, CNM [facility]    Sharyon Cable, CNM Center for [employer], [facility]

## 2019-04-28 ENCOUNTER — Ambulatory Visit (INDEPENDENT_AMBULATORY_CARE_PROVIDER_SITE_OTHER): Payer: BC Managed Care – PPO | Admitting: Certified Nurse Midwife

## 2019-04-28 ENCOUNTER — Other Ambulatory Visit: Payer: Self-pay

## 2019-04-28 ENCOUNTER — Encounter: Payer: Self-pay | Admitting: Certified Nurse Midwife

## 2019-04-28 VITALS — BP 110/68 | HR 81 | Temp 98.3°F | Wt 179.0 lb

## 2019-04-28 DIAGNOSIS — O99013 Anemia complicating pregnancy, third trimester: Secondary | ICD-10-CM

## 2019-04-28 DIAGNOSIS — Z113 Encounter for screening for infections with a predominantly sexual mode of transmission: Secondary | ICD-10-CM

## 2019-04-28 DIAGNOSIS — Z3A36 36 weeks gestation of pregnancy: Secondary | ICD-10-CM

## 2019-04-28 DIAGNOSIS — D649 Anemia, unspecified: Secondary | ICD-10-CM | POA: Diagnosis not present

## 2019-04-28 DIAGNOSIS — Z3480 Encounter for supervision of other normal pregnancy, unspecified trimester: Secondary | ICD-10-CM

## 2019-04-28 LAB — OB RESULTS CONSOLE GBS: GBS: NEGATIVE

## 2019-04-28 NOTE — Progress Notes (Signed)
   PRENATAL VISIT NOTE  Subjective:  Jillian Holden is a 34 y.o. G2P1001 at [redacted]w[redacted]d being seen today for ongoing prenatal care.  She is currently monitored for the following issues for this low-risk pregnancy and has Low grade squamous intraepith lesion on cytologic smear cervix (lgsil); Supervision of other normal pregnancy, antepartum; Choroid plexus cyst of fetus on prenatal ultrasound; and Anemia of mother in pregnancy on their problem list.  Patient reports no complaints.  Contractions: Not present. Vag. Bleeding: None.  Movement: Present. Denies leaking of fluid.   The following portions of the patient's history were reviewed and updated as appropriate: allergies, current medications, past family history, past medical history, past social history, past surgical history and problem list.   Objective:   Vitals:   04/28/19 0816  BP: 110/68  Pulse: 81  Temp: 98.3 F (36.8 C)  Weight: 179 lb (81.2 kg)    Fetal Status: Fetal Heart Rate (bpm): 125 Fundal Height: 37 cm Movement: Present  Presentation: Vertex  General:  Alert, oriented and cooperative. Patient is in no acute distress.  Skin: Skin is warm and dry. No rash noted.   Cardiovascular: Normal heart rate noted  Respiratory: Normal respiratory effort, no problems with respiration noted  Abdomen: Soft, gravid, appropriate for gestational age.  Pain/Pressure: Present     Pelvic: Cervical exam performed in the presence of a chaperone Dilation: 2 Effacement (%): 50 Station: -2  Extremities: Normal range of motion.  Edema: None  Mental Status: Normal mood and affect. Normal behavior. Normal judgment and thought content.   Assessment and Plan:  Pregnancy: G2P1001 at [redacted]w[redacted]d 1. Supervision of other normal pregnancy, antepartum - patient doing well, no complaints  - routine prenatal care - anticipatory guidance on upcoming appointments, with next one being mychart appointment - patient is concerned about mychart appointment d/t last  pregnancy and would prefer in person appointment  - Patient plans on bringing birth plan to next appointment - patient does not have long list for birth plan but mainly wants a natural labor and delivery, does not want to be ask about pain medication during labor.  - Preterm labor precautions and reasons to present to MAU discussed - Culture, beta strep (group b only) - Cervicovaginal ancillary only( [place])  2. Anemia during pregnancy in third trimester - CBC  Preterm labor symptoms and general obstetric precautions including but not limited to vaginal bleeding, contractions, leaking of fluid and fetal movement were reviewed in detail with the patient. Please refer to After Visit Summary for other counseling recommendations.   Return in about 2 weeks (around 05/12/2019) for ROB.  Future Appointments  Date Time Provider Department Center  05/12/2019 11:15 AM Sharyon Cable, CNM [facility]  05/19/2019  9:35 AM Leftwich-Kirby, Wilmer Floor, CNM [facility]    Sharyon Cable, CNM

## 2019-04-28 NOTE — Patient Instructions (Signed)
Reasons to go to MAU:  1.  Contractions are  5 minutes apart or less, each last 1 minute, these have been going on for 1-2 hours, and you cannot walk or talk during them 2.  You have a large gush of fluid, or a trickle of fluid that will not stop and you have to wear a pad 3.  You have bleeding that is bright red, heavier than spotting--like menstrual bleeding (spotting can be normal in early labor or after a check of your cervix) 4.  You do not feel the baby moving like he/she normally does   Third Trimester of Pregnancy  The third trimester is from week 28 through week 40 (months 7 through 9). This trimester is when your unborn baby (fetus) is growing very fast. At the end of the ninth month, the unborn baby is about 20 inches in length. It weighs about 6-10 pounds. Follow these instructions at home: Medicines  Take over-the-counter and prescription medicines only as told by your doctor. Some medicines are safe and some medicines are not safe during pregnancy.  Take a prenatal vitamin that contains at least 600 micrograms (mcg) of folic acid.  If you have trouble pooping (constipation), take medicine that will make your stool soft (stool softener) if your doctor approves. Eating and drinking   Eat regular, healthy meals.  Avoid raw meat and uncooked cheese.  If you get low calcium from the food you eat, talk to your doctor about taking a daily calcium supplement.  Eat four or five small meals rather than three large meals a day.  Avoid foods that are high in fat and sugars, such as fried and sweet foods.  To prevent constipation: ? Eat foods that are high in fiber, like fresh fruits and vegetables, whole grains, and beans. ? Drink enough fluids to keep your pee (urine) clear or pale yellow. Activity  Exercise only as told by your doctor. Stop exercising if you start to have cramps.  Avoid heavy lifting, wear low heels, and sit up straight.  Do not exercise if it is too  hot, too humid, or if you are in a place of great height (high altitude).  You may continue to have sex unless your doctor tells you not to. Relieving pain and discomfort  Wear a good support bra if your breasts are tender.  Take frequent breaks and rest with your legs raised if you have leg cramps or low back pain.  Take warm water baths (sitz baths) to soothe pain or discomfort caused by hemorrhoids. Use hemorrhoid cream if your doctor approves.  If you develop puffy, bulging veins (varicose veins) in your legs: ? Wear support hose or compression stockings as told by your doctor. ? Raise (elevate) your feet for 15 minutes, 3-4 times a day. ? Limit salt in your food. Safety  Wear your seat belt when driving.  Make a list of emergency phone numbers, including numbers for family, friends, the hospital, and police and fire departments. Preparing for your baby's arrival To prepare for the arrival of your baby:  Take prenatal classes.  Practice driving to the hospital.  Visit the hospital and tour the maternity area.  Talk to your work about taking leave once the baby comes.  Pack your hospital bag.  Prepare the baby's room.  Go to your doctor visits.  Buy a rear-facing car seat. Learn how to install it in your car. General instructions  Do not use hot tubs, steam rooms, or  saunas.  Do not use any products that contain nicotine or tobacco, such as cigarettes and e-cigarettes. If you need help quitting, ask your doctor.  Do not drink alcohol.  Do not douche or use tampons or scented sanitary pads.  Do not cross your legs for long periods of time.  Do not travel for long distances unless you must. Only do so if your doctor says it is okay.  Visit your dentist if you have not gone during your pregnancy. Use a soft toothbrush to brush your teeth. Be gentle when you floss.  Avoid cat litter boxes and soil used by cats. These carry germs that can cause birth defects in the  baby and can cause a loss of your baby (miscarriage) or stillbirth.  Keep all your prenatal visits as told by your doctor. This is important. Contact a doctor if:  You are not sure if you are in labor or if your water has broken.  You are dizzy.  You have mild cramps or pressure in your lower belly.  You have a nagging pain in your belly area.  You continue to feel sick to your stomach, you throw up, or you have watery poop.  You have bad smelling fluid coming from your vagina.  You have pain when you pee. Get help right away if:  You have a fever.  You are leaking fluid from your vagina.  You are spotting or bleeding from your vagina.  You have severe belly cramps or pain.  You lose or gain weight quickly.  You have trouble catching your breath and have chest pain.  You notice sudden or extreme puffiness (swelling) of your face, hands, ankles, feet, or legs.  You have not felt the baby move in over an hour.  You have severe headaches that do not go away with medicine.  You have trouble seeing.  You are leaking, or you are having a gush of fluid, from your vagina before you are 37 weeks.  You have regular belly spasms (contractions) before you are 37 weeks. Summary  The third trimester is from week 28 through week 40 (months 7 through 9). This time is when your unborn baby is growing very fast.  Follow your doctor's advice about medicine, food, and activity.  Get ready for the arrival of your baby by taking prenatal classes, getting all the baby items ready, preparing the baby's room, and visiting your doctor to be checked.  Get help right away if you are bleeding from your vagina, or you have chest pain and trouble catching your breath, or if you have not felt your baby move in over an hour. This information is not intended to replace advice given to you by your health care provider. Make sure you discuss any questions you have with your health care  provider. Document Revised: 05/08/2018 Document Reviewed: 02/21/2016 Elsevier Patient Education  [place].

## 2019-04-29 LAB — CERVICOVAGINAL ANCILLARY ONLY
Chlamydia: NEGATIVE
Comment: NEGATIVE
Comment: NORMAL
Neisseria Gonorrhea: NEGATIVE

## 2019-05-01 LAB — CBC
HCT: 36.6 % (ref 35.0–45.0)
Hemoglobin: 12.5 g/dL (ref 11.7–15.5)
MCH: 31.5 pg (ref 27.0–33.0)
MCHC: 34.2 g/dL (ref 32.0–36.0)
MCV: 92.2 fL (ref 80.0–100.0)
MPV: 10.7 fL (ref 7.5–12.5)
Platelets: 183 10*3/uL (ref 140–400)
RBC: 3.97 10*6/uL (ref 3.80–5.10)
RDW: 19.4 % — ABNORMAL HIGH (ref 11.0–15.0)
WBC: 9.4 10*3/uL (ref 3.8–10.8)

## 2019-05-01 LAB — CULTURE, BETA STREP (GROUP B ONLY)
MICRO NUMBER:: 10309344
SPECIMEN QUALITY:: ADEQUATE

## 2019-05-04 ENCOUNTER — Encounter: Payer: Self-pay | Admitting: Certified Nurse Midwife

## 2019-05-06 ENCOUNTER — Other Ambulatory Visit: Payer: Self-pay | Admitting: Certified Nurse Midwife

## 2019-05-06 DIAGNOSIS — O99013 Anemia complicating pregnancy, third trimester: Secondary | ICD-10-CM

## 2019-05-06 MED ORDER — FERROUS GLUCONATE 324 (38 FE) MG PO TABS: 324.0000 mg | ORAL_TABLET | Freq: Every day | ORAL | 1 refills | Status: DC

## 2019-05-12 ENCOUNTER — Other Ambulatory Visit: Payer: Self-pay

## 2019-05-12 ENCOUNTER — Ambulatory Visit (INDEPENDENT_AMBULATORY_CARE_PROVIDER_SITE_OTHER): Payer: BC Managed Care – PPO | Admitting: Certified Nurse Midwife

## 2019-05-12 ENCOUNTER — Encounter: Payer: Self-pay | Admitting: Certified Nurse Midwife

## 2019-05-12 VITALS — BP 117/73 | HR 85 | Wt 179.0 lb

## 2019-05-12 DIAGNOSIS — Z3480 Encounter for supervision of other normal pregnancy, unspecified trimester: Secondary | ICD-10-CM

## 2019-05-12 NOTE — Patient Instructions (Signed)
Reasons to go to MAU:  1.  Contractions are  5 minutes apart or less, each last 1 minute, these have been going on for 1-2 hours, and you cannot walk or talk during them 2.  You have a large gush of fluid, or a trickle of fluid that will not stop and you have to wear a pad 3.  You have bleeding that is bright red, heavier than spotting--like menstrual bleeding (spotting can be normal in early labor or after a check of your cervix) 4.  You do not feel the baby moving like he/she normally does  

## 2019-05-12 NOTE — Progress Notes (Signed)
   PRENATAL VISIT NOTE  Subjective:  Jillian Holden is a 34 y.o. G2P1001 at [redacted]w[redacted]d being seen today for ongoing prenatal care.  She is currently monitored for the following issues for this low-risk pregnancy and has Low grade squamous intraepith lesion on cytologic smear cervix (lgsil); Supervision of other normal pregnancy, antepartum; Choroid plexus cyst of fetus on prenatal ultrasound; and Anemia of mother in pregnancy on their problem list.  Patient reports occasional contractions.  Contractions: Irregular. Vag. Bleeding: None.  Movement: Present. Denies leaking of fluid.   The following portions of the patient's history were reviewed and updated as appropriate: allergies, current medications, past family history, past medical history, past social history, past surgical history and problem list.   Objective:   Vitals:   05/12/19 1109  BP: 117/73  Pulse: 85  Weight: 179 lb (81.2 kg)    Fetal Status: Fetal Heart Rate (bpm): 134 Fundal Height: 38 cm Movement: Present  Presentation: Vertex  General:  Alert, oriented and cooperative. Patient is in no acute distress.  Skin: Skin is warm and dry. No rash noted.   Cardiovascular: Normal heart rate noted  Respiratory: Normal respiratory effort, no problems with respiration noted  Abdomen: Soft, gravid, appropriate for gestational age.  Pain/Pressure: Present     Pelvic: Cervical exam performed in the presence of a chaperone Dilation: 3 Effacement (%): 60 Station: -2  Extremities: Normal range of motion.  Edema: Trace  Mental Status: Normal mood and affect. Normal behavior. Normal judgment and thought content.   Assessment and Plan:  Pregnancy: G2P1001 at [redacted]w[redacted]d 1. Supervision of other normal pregnancy, antepartum - Patient doing well, reports occasional contractions and pelvic pressure that started last night and continued today  - cervical examination 3/60/-2 today  - labor precautions and reasons to present to MAU discussed  -  Follow up as scheduled for routine OB appointments, membrane sweep at next appointment if not delivered prior   Term labor symptoms and general obstetric precautions including but not limited to vaginal bleeding, contractions, leaking of fluid and fetal movement were reviewed in detail with the patient. Please refer to After Visit Summary for other counseling recommendations.   Return in about 1 week (around 05/19/2019) for ROB.  Future Appointments  Date Time Provider Department Center  05/19/2019  9:35 AM Leftwich-Kirby, Wilmer Floor, CNM [facility]    Sharyon Cable, CNM

## 2019-05-19 ENCOUNTER — Ambulatory Visit (INDEPENDENT_AMBULATORY_CARE_PROVIDER_SITE_OTHER): Payer: BC Managed Care – PPO | Admitting: Advanced Practice Midwife

## 2019-05-19 ENCOUNTER — Other Ambulatory Visit: Payer: Self-pay

## 2019-05-19 VITALS — BP 116/73 | HR 84 | Temp 98.4°F | Wt 178.0 lb

## 2019-05-19 DIAGNOSIS — Z3480 Encounter for supervision of other normal pregnancy, unspecified trimester: Secondary | ICD-10-CM

## 2019-05-19 NOTE — Patient Instructions (Signed)

## 2019-05-19 NOTE — Progress Notes (Signed)
   PRENATAL VISIT NOTE  Subjective:  Jillian Holden is a 34 y.o. G2P1001 at [redacted]w[redacted]d being seen today for ongoing prenatal care.  She is currently monitored for the following issues for this low-risk pregnancy and has Low grade squamous intraepith lesion on cytologic smear cervix (lgsil); Supervision of other normal pregnancy, antepartum; Choroid plexus cyst of fetus on prenatal ultrasound; and Anemia of mother in pregnancy on their problem list.  Patient reports occasional contractions.  Contractions: Irregular. Vag. Bleeding: None.  Movement: Present. Denies leaking of fluid.   The following portions of the patient's history were reviewed and updated as appropriate: allergies, current medications, past family history, past medical history, past social history, past surgical history and problem list.   Objective:   Vitals:   05/19/19 0926  BP: 116/73  Pulse: 84  Temp: 98.4 F (36.9 C)  Weight: 178 lb (80.7 kg)    Fetal Status: Fetal Heart Rate (bpm): 131 Fundal Height: 39 cm Movement: Present  Presentation: Vertex  General:  Alert, oriented and cooperative. Patient is in no acute distress.  Skin: Skin is warm and dry. No rash noted.   Cardiovascular: Normal heart rate noted  Respiratory: Normal respiratory effort, no problems with respiration noted  Abdomen: Soft, gravid, appropriate for gestational age.  Pain/Pressure: Present     Pelvic: Cervical exam performed in the presence of a chaperone Dilation: 4 Effacement (%): 70 Station: -2  Extremities: Normal range of motion.  Edema: Trace  Mental Status: Normal mood and affect. Normal behavior. Normal judgment and thought content.   Assessment and Plan:  Pregnancy: G2P1001 at [redacted]w[redacted]d 1. Supervision of other normal pregnancy, antepartum --Anticipatory guidance about next visits/weeks of pregnancy given. --Membranes swept with today's exam at pt request --Discussed the [employer] to improve fetal position/encourage onset of  labor --F/U in one week for NST at 40+ weeks if labor does not begin this week  Term labor symptoms and general obstetric precautions including but not limited to vaginal bleeding, contractions, leaking of fluid and fetal movement were reviewed in detail with the patient. Please refer to After Visit Summary for other counseling recommendations.   Return in about 1 week (around 05/26/2019).  Future Appointments  Date Time Provider Department Center  05/26/2019 11:15 AM Sharyon Cable, CNM [facility]    Sharen Counter, CNM

## 2019-05-25 ENCOUNTER — Inpatient Hospital Stay (HOSPITAL_COMMUNITY)
Admission: RE | Admit: 2019-05-25 | Discharge: 2019-05-27 | DRG: 807 | Disposition: A | Discharge status: Home / Self Care | Payer: BC Managed Care – PPO | Attending: Obstetrics and Gynecology | Admitting: Obstetrics and Gynecology

## 2019-05-25 ENCOUNTER — Other Ambulatory Visit: Payer: Self-pay

## 2019-05-25 ENCOUNTER — Ambulatory Visit (INDEPENDENT_AMBULATORY_CARE_PROVIDER_SITE_OTHER): Payer: BC Managed Care – PPO | Admitting: Obstetrics & Gynecology

## 2019-05-25 ENCOUNTER — Encounter (HOSPITAL_COMMUNITY): Payer: Self-pay | Admitting: Obstetrics and Gynecology

## 2019-05-25 VITALS — BP 106/67 | HR 88 | Temp 98.7°F | Wt 181.0 lb

## 2019-05-25 DIAGNOSIS — O368130 Decreased fetal movements, third trimester, not applicable or unspecified: Secondary | ICD-10-CM | POA: Diagnosis present

## 2019-05-25 DIAGNOSIS — Z3490 Encounter for supervision of normal pregnancy, unspecified, unspecified trimester: Secondary | ICD-10-CM | POA: Diagnosis present

## 2019-05-25 DIAGNOSIS — O9902 Anemia complicating childbirth: Secondary | ICD-10-CM | POA: Diagnosis present

## 2019-05-25 DIAGNOSIS — Z3A40 40 weeks gestation of pregnancy: Secondary | ICD-10-CM

## 2019-05-25 DIAGNOSIS — D649 Anemia, unspecified: Secondary | ICD-10-CM | POA: Diagnosis present

## 2019-05-25 DIAGNOSIS — Z20822 Contact with and (suspected) exposure to covid-19: Secondary | ICD-10-CM | POA: Diagnosis present

## 2019-05-25 DIAGNOSIS — O368190 Decreased fetal movements, unspecified trimester, not applicable or unspecified: Secondary | ICD-10-CM | POA: Diagnosis present

## 2019-05-25 LAB — CBC
HCT: 38.3 % (ref 36.0–46.0)
Hemoglobin: 12.7 g/dL (ref 12.0–15.0)
MCH: 32.1 pg (ref 26.0–34.0)
MCHC: 33.2 g/dL (ref 30.0–36.0)
MCV: 96.7 fL (ref 80.0–100.0)
Platelets: 205 10*3/uL (ref 150–400)
RBC: 3.96 MIL/uL (ref 3.87–5.11)
RDW: 16.3 % — ABNORMAL HIGH (ref 11.5–15.5)
WBC: 9.3 10*3/uL (ref 4.0–10.5)
nRBC: 0 % (ref 0.0–0.2)

## 2019-05-25 LAB — TYPE AND SCREEN
ABO/RH(D): O POS
Antibody Screen: NEGATIVE

## 2019-05-25 LAB — SARS CORONAVIRUS 2 (TAT 6-24 HRS): SARS Coronavirus 2: NEGATIVE

## 2019-05-25 LAB — ABO/RH: ABO/RH(D): O POS

## 2019-05-25 MED ORDER — OXYCODONE-ACETAMINOPHEN 5-325 MG PO TABS: 1.0000 | ORAL_TABLET | ORAL | Status: DC | PRN

## 2019-05-25 MED ORDER — TERBUTALINE SULFATE 1 MG/ML IJ SOLN: 0.2500 mg | Freq: Once | INTRAMUSCULAR | Status: DC | PRN

## 2019-05-25 MED ORDER — OXYTOCIN 40 UNITS IN NORMAL SALINE INFUSION - SIMPLE MED: 2.5000 [IU]/h | INTRAVENOUS | Status: DC

## 2019-05-25 MED ORDER — LIDOCAINE HCL (PF) 1 % IJ SOLN
30.0000 mL | INTRAMUSCULAR | Status: AC | PRN
  Administered 2019-05-25: 30 mL via SUBCUTANEOUS
  Filled 2019-05-25: qty 30

## 2019-05-25 MED ORDER — OXYTOCIN 40 UNITS IN NORMAL SALINE INFUSION - SIMPLE MED
1.0000 m[IU]/min | INTRAVENOUS | Status: DC
  Administered 2019-05-25: 2 m[IU]/min via INTRAVENOUS
  Filled 2019-05-25: qty 1000

## 2019-05-25 MED ORDER — OXYTOCIN BOLUS FROM INFUSION
500.0000 mL | Freq: Once | INTRAVENOUS | Status: AC
  Administered 2019-05-25: 500 mL via INTRAVENOUS

## 2019-05-25 MED ORDER — DIBUCAINE (PERIANAL) 1 % EX OINT: 1.0000 "application " | TOPICAL_OINTMENT | CUTANEOUS | Status: DC | PRN

## 2019-05-25 MED ORDER — FENTANYL CITRATE (PF) 100 MCG/2ML IJ SOLN
INTRAMUSCULAR | Status: AC
  Filled 2019-05-25: qty 2

## 2019-05-25 MED ORDER — FENTANYL CITRATE (PF) 100 MCG/2ML IJ SOLN
100.0000 ug | Freq: Once | INTRAMUSCULAR | Status: AC
  Administered 2019-05-25: 100 ug via INTRAVENOUS

## 2019-05-25 MED ORDER — LACTATED RINGERS IV SOLN: 500.0000 mL | INTRAVENOUS | Status: DC | PRN

## 2019-05-25 MED ORDER — ACETAMINOPHEN 325 MG PO TABS: 650.0000 mg | ORAL_TABLET | ORAL | Status: DC | PRN

## 2019-05-25 MED ORDER — COCONUT OIL OIL: 1.0000 "application " | TOPICAL_OIL | Status: DC | PRN

## 2019-05-25 MED ORDER — SOD CITRATE-CITRIC ACID 500-334 MG/5ML PO SOLN: 30.0000 mL | ORAL | Status: DC | PRN

## 2019-05-25 MED ORDER — TETANUS-DIPHTH-ACELL PERTUSSIS 5-2.5-18.5 LF-MCG/0.5 IM SUSP: 0.5000 mL | Freq: Once | INTRAMUSCULAR | Status: DC

## 2019-05-25 MED ORDER — OXYCODONE HCL 5 MG PO TABS: 5.0000 mg | ORAL_TABLET | ORAL | Status: DC | PRN

## 2019-05-25 MED ORDER — OXYCODONE-ACETAMINOPHEN 5-325 MG PO TABS: 2.0000 | ORAL_TABLET | ORAL | Status: DC | PRN

## 2019-05-25 MED ORDER — ONDANSETRON HCL 4 MG/2ML IJ SOLN: 4.0000 mg | Freq: Four times a day (QID) | INTRAMUSCULAR | Status: DC | PRN

## 2019-05-25 MED ORDER — ONDANSETRON HCL 4 MG PO TABS: 4.0000 mg | ORAL_TABLET | ORAL | Status: DC | PRN

## 2019-05-25 MED ORDER — DIPHENHYDRAMINE HCL 25 MG PO CAPS: 25.0000 mg | ORAL_CAPSULE | Freq: Four times a day (QID) | ORAL | Status: DC | PRN

## 2019-05-25 MED ORDER — LACTATED RINGERS IV SOLN: INTRAVENOUS | Status: DC

## 2019-05-25 MED ORDER — IBUPROFEN 600 MG PO TABS
600.0000 mg | ORAL_TABLET | Freq: Four times a day (QID) | ORAL | Status: DC
  Administered 2019-05-25 – 2019-05-27 (×7): 600 mg via ORAL
  Filled 2019-05-25 (×7): qty 1

## 2019-05-25 MED ORDER — PRENATAL MULTIVITAMIN CH
1.0000 | ORAL_TABLET | Freq: Every day | ORAL | Status: DC
  Administered 2019-05-26 – 2019-05-27 (×2): 1 via ORAL
  Filled 2019-05-25 (×2): qty 1

## 2019-05-25 MED ORDER — BENZOCAINE-MENTHOL 20-0.5 % EX AERO
1.0000 "application " | INHALATION_SPRAY | CUTANEOUS | Status: DC | PRN
  Administered 2019-05-25: 1 via TOPICAL
  Filled 2019-05-25: qty 56

## 2019-05-25 MED ORDER — ONDANSETRON HCL 4 MG/2ML IJ SOLN: 4.0000 mg | INTRAMUSCULAR | Status: DC | PRN

## 2019-05-25 MED ORDER — OXYCODONE HCL 5 MG PO TABS: 10.0000 mg | ORAL_TABLET | ORAL | Status: DC | PRN

## 2019-05-25 MED ORDER — SENNOSIDES-DOCUSATE SODIUM 8.6-50 MG PO TABS
2.0000 | ORAL_TABLET | ORAL | Status: DC
  Administered 2019-05-25 – 2019-05-26 (×2): 2 via ORAL
  Filled 2019-05-25 (×2): qty 2

## 2019-05-25 MED ORDER — WITCH HAZEL-GLYCERIN EX PADS: 1.0000 "application " | MEDICATED_PAD | CUTANEOUS | Status: DC | PRN

## 2019-05-25 MED ORDER — SIMETHICONE 80 MG PO CHEW: 80.0000 mg | CHEWABLE_TABLET | ORAL | Status: DC | PRN

## 2019-05-25 NOTE — Progress Notes (Signed)
Patient Vitals for the past 24 hrs:  BP Temp Temp src Pulse Resp Height Weight  05/25/19 1645 115/67 -- -- 92 20 -- --  05/25/19 1538 120/67 98.2 F (36.8 C) Oral 89 -- -- --  05/25/19 1330 115/65 -- -- 82 16 -- --  05/25/19 1241 117/63 -- -- 95 18 -- --  05/25/19 1205 -- -- -- -- -- 5\' 5"  (1.651 m) 82.4 kg   Patient feeling urge to pushing during contractions, reports relief after contraction  Breathing well and coping through contractions, has received 2 doses of IV Fentanyl   Dilation: 7 Effacement (%): 100 Station: 0 Presentation: Vertex Exam by:: [email or web address] RN  FHR 120/moderate/+accels/ variable and early decelerations  Expectant management with delivery soon  Cat II tracing  Plan SVD   Viona Gilmore, CNM 05/25/19, 6:03 PM

## 2019-05-25 NOTE — Progress Notes (Signed)
   PRENATAL VISIT NOTE  Subjective:  Jillian Holden is a 34 y.o. G2P1001 at [redacted]w[redacted]d being seen today for ongoing prenatal care.  She is currently monitored for the following issues for this low-risk pregnancy and has Low grade squamous intraepith lesion on cytologic smear cervix (lgsil); Supervision of other normal pregnancy, antepartum; Choroid plexus cyst of fetus on prenatal ultrasound; and Anemia of mother in pregnancy on their problem list.  Patient reports the following..  Contractions: Irritability. Vag. Bleeding: None.  Movement: (!) Decreased. Denies leaking of fluid.   The following portions of the patient's history were reviewed and updated as appropriate: allergies, current medications, past family history, past medical history, past social history, past surgical history and problem list.   Objective:   Vitals:   05/25/19 0856  BP: 106/67  Pulse: 88  Temp: 98.7 F (37.1 C)  Weight: 181 lb (82.1 kg)    Fetal Status:     Movement: (!) Decreased     General:  Alert, oriented and cooperative. Patient is in no acute distress.  Skin: Skin is warm and dry. No rash noted.   Cardiovascular: Normal heart rate noted  Respiratory: Normal respiratory effort, no problems with respiration noted  Abdomen: Soft, gravid, appropriate for gestational age.  Pain/Pressure: Present     Pelvic: Cervical exam performed in the presence of a chaperone      4/50/-2  Extremities: Normal range of motion.  Edema: Trace  Mental Status: Normal mood and affect. Normal behavior. Normal judgment and thought content.   Assessment and Plan:  Pregnancy: G2P1001 at [redacted]w[redacted]d  1.  Term, low risk pregnancy.  NST reactive.  Cervix is favorable. Pt having new onset decreased FM for 2 days.  Pt opts for induction and calling L & D today.    Baseline: 120   Accelerations:  + Decelerations:  absent Variability:  moderate Interpretation:  Reactive NST  Term labor symptoms and general obstetric precautions  including but not limited to vaginal bleeding, contractions, leaking of fluid and fetal movement were reviewed in detail with the patient. Please refer to After Visit Summary for other counseling recommendations.     Elsie Lincoln, MD

## 2019-05-25 NOTE — Discharge Summary (Signed)
Postpartum Discharge Summary     Patient Name: Jillian Holden DOB: [birth year] MRN: [id number]  Date of admission: 05/25/2019 Delivering Provider: Lajean Manes   Date of discharge: 05/27/2019  Admitting diagnosis: Decreased fetal movement affecting management of mother, antepartum [O36.8190] Intrauterine pregnancy: [redacted]w[redacted]d    Secondary diagnosis:  Active Problems:   Decreased fetal movement affecting management of mother, antepartum   Encounter for induction of labor   SVD (spontaneous vaginal delivery)  Additional problems: none      Discharge diagnosis: Term Pregnancy Delivered                                                                                                Post partum procedures:none  Augmentation: AROM and Pitocin  Complications: None  Hospital course:  Induction of Labor With Vaginal Delivery   34y.o. yo G2P1001 at 471w0das admitted to the hospital 05/25/2019 for induction of labor.  Indication for induction: decreased fetal movement.  Patient had an uncomplicated labor course as follows: Membrane Rupture Time/Date: 12:22 PM ,05/25/2019   Intrapartum Procedures: Episiotomy: None [1]                                         Lacerations:  1st degree [2];Vaginal [6]  Patient had delivery of a Viable infant.  Information for the patient's newborn:  FoOkema, Rollinson0[[id number]]Delivery Method: Vag-Spont    05/25/2019  Details of delivery can be found in separate delivery note.  Patient had a routine postpartum course. Patient is discharged home 05/27/19. Delivery time: 7:25 PM    Magnesium Sulfate received: No BMZ received: No Rhophylac:N/A MMR:N/A Transfusion:No  Physical exam  Vitals:   05/26/19 1000 05/26/19 1317 05/26/19 2308 05/27/19 0501  BP: (!) 102/58 111/66 102/69 (!) 98/55  Pulse: 82 83 83 84  Resp: 16 18  16   Temp: 98.6 F (37 C) 98.3 F (36.8 C) 98 F (36.7 C) 97.7 F (36.5 C)  TempSrc: Oral Oral Oral Oral  SpO2:    98% 97%  Weight:      Height:       General: alert, cooperative and no distress Lochia: appropriate Uterine Fundus: firm Incision: N/A DVT Evaluation: No evidence of DVT seen on physical exam. Labs: Lab Results  Component Value Date   WBC 15.3 (H) 05/26/2019   HGB 11.9 (L) 05/26/2019   HCT 36.0 05/26/2019   MCV 95.5 05/26/2019   PLT 174 05/26/2019   CMP Latest Ref Rng & Units 03/06/2015  Glucose 65 - 99 mg/dL 103(H)  BUN 6 - 20 mg/dL 7  Creatinine 0.44 - 1.00 mg/dL 0.48  Sodium 135 - 145 mmol/L 133(L)  Potassium 3.5 - 5.1 mmol/L 3.5  Chloride 101 - 111 mmol/L 102  CO2 22 - 32 mmol/L 23  Calcium 8.9 - 10.3 mg/dL 8.5(L)  Total Protein 6.5 - 8.1 g/dL 6.8  Total Bilirubin 0.3 - 1.2 mg/dL 0.5  Alkaline Phos 38 - 126 U/L 45  AST 15 -  41 U/L 18  ALT 14 - 54 U/L 16   Edinburgh Score: Edinburgh Postnatal Depression Scale Screening Tool 05/26/2019  I have been able to laugh and see the funny side of things. 0  I have looked forward with enjoyment to things. 0  I have blamed myself unnecessarily when things went wrong. 1  I have been anxious or worried for no good reason. 0  I have felt scared or panicky for no good reason. 0  Things have been getting on top of me. 0  I have been so unhappy that I have had difficulty sleeping. 0  I have felt sad or miserable. 0  I have been so unhappy that I have been crying. 0  The thought of harming myself has occurred to me. 0  Edinburgh Postnatal Depression Scale Total 1    Discharge instruction: per After Visit Summary and "Baby and Me Booklet".  After visit meds:  Allergies as of 05/27/2019      Reactions   Ambien [zolpidem Tartrate] Other (See Comments)   Hallucination       Medication List    STOP taking these medications   calcium gluconate 500 MG tablet   doxylamine (Sleep) 25 MG tablet Commonly known as: UNISOM   ferrous sulfate 325 (65 FE) MG tablet Commonly known as: FerrouSul   metoCLOPramide 10 MG tablet Commonly  known as: Reglan   multivitamin with minerals Tabs tablet     TAKE these medications   Blood Pressure Monitor Kit 1 kit by Does not apply route once a week. CHECK BP WEEKLY. LARGE CUFF  DX: O90.0   multivitamin-prenatal 27-0.8 MG Tabs tablet Take 1 tablet by mouth daily at 12 noon.   omega-3 acid ethyl esters 1 g capsule Commonly known as: LOVAZA Take by mouth daily.       Diet: routine diet  Activity: Advance as tolerated. Pelvic rest for 6 weeks.   Outpatient follow up:4 weeks Follow up Appt: Future Appointments  Date Time Provider [place]  06/30/2019  8:15 AM Leftwich-Kirby, Kathie Dike, CNM [facility]   Follow up Visit:   Please schedule this patient for Postpartum visit in: 4 weeks with the following provider: Any provider Virtual For C/S patients schedule nurse incision check in weeks 2 weeks: no Low risk pregnancy complicated by: anemia Delivery mode:  SVD Anticipated Birth Control:  husband getting vasectomy  PP Procedures needed: none  Schedule Integrated BH visit: no   Newborn Data: Live born female  Birth Weight:  3585g APGAR: 34, 9  Newborn Delivery   Birth date/time: 05/25/2019 19:25:00 Delivery type: Vaginal, Spontaneous      Baby Feeding: Breast Disposition:home with mother   05/27/2019 Chauncey Mann, MD

## 2019-05-25 NOTE — Lactation Note (Signed)
This note was copied from a baby's chart. Lactation Consultation Note  Patient Name: Jillian Holden [id number] Date: 05/25/2019 Reason for consult: Initial assessment;Term  Visited with mom of a 3 hours old FT female; she's a P2 and experienced BF. She BF her first child for 12 months and reported no BF difficulties. She's also familiar with hand expression, her RN Archie Endo has been very proactive and showed her how to spoon fed baby. Mom's plan is to exclusively BF the first 3-4 weeks of life before introducing a bottle, praised her for her efforts. She has a Spectra 2 DEBP at home.  Mom doing STS with baby when entering the room, she told LC baby just fed not too long ago; he was asleep. Asked mom to call for assistance when needed. Per mom BF is going well and baby is latching on. Reviewed normal newborn behavior, size of baby's stomach, lactogenesis II, cluster feeding and feeding cues.   Feeding plan:  1. Encouraged mom to keep feeding baby STS 8-12 times/24 hours or sooner if feeding cues are present. 2. Hand expression and spoon feeding were also encouraged  BF brochure, BF resources and feeding diary were reviewed. Dad present and supportive. Parents reported all questions and concerns were answered, they're both aware of LC OP services and will call PRN.   Maternal Data Formula Feeding for Exclusion: No Has patient been taught Hand Expression?: Yes Does the patient have breastfeeding experience prior to this delivery?: Yes  Feeding Feeding Type: Breast Fed  LATCH Score Latch: Grasps breast easily, tongue down, lips flanged, rhythmical sucking.  Audible Swallowing: A few with stimulation  Type of Nipple: Everted at rest and after stimulation  Comfort (Breast/Nipple): Soft / non-tender  Hold (Positioning): Assistance needed to correctly position infant at breast and maintain latch.  LATCH Score: 8  Interventions Interventions: Breast feeding basics reviewed  Lactation  Tools Discussed/Used WIC Program: No   Consult Status Consult Status: Follow-up Date: 05/26/19 Follow-up type: In-patient    Nycere Presley Venetia Constable 05/25/2019, 11:12 PM

## 2019-05-25 NOTE — H&P (Signed)
LABOR AND DELIVERY ADMISSION HISTORY AND PHYSICAL NOTE  Jillian Holden is a 34 y.o. female G2P1001 with IUP at 16w0dby LMP presenting for IOL secondary to decreased fetal movement. Patient reports she is feeling movement just not as much as normal over the past 2 days. She reports feeling increased pressure but denies contractions. She denies leakage of fluid or vaginal bleeding.  [place]:  - Anemia of mother in pregnancy  - Decreased fetal movement   Past Medical History: Past Medical History:  Diagnosis Date  . Anemia   . Anxiety   . Bipolar 1 disorder ([place]   . Bipolar 2 disorder ([facility]   . Depression   . Headache   . Insomnia   . PTSD (post-traumatic stress disorder)   . Vaginal Pap smear, abnormal     Past Surgical History: Past Surgical History:  Procedure Laterality Date  . NO PAST SURGERIES      Obstetrical History: OB History    Gravida  2   Para  1   Term  1   Preterm  0   AB  0   Living  1     SAB  0   TAB  0   Ectopic  0   Multiple  0   Live Births  1           Social History: Social History   Socioeconomic History  . Marital status: Married    Spouse name: Not on file  . Number of children: 1  . Years of education: Not on file  . Highest education level: Not on file  Occupational History  . Not on file  Tobacco Use  . Smoking status: Never Smoker  . Smokeless tobacco: Never Used  Substance and Sexual Activity  . Alcohol use: No    Alcohol/week: 0.0 standard drinks    Comment: Socially  . Drug use: No  . Sexual activity: Yes    Partners: Male    Birth control/protection: None  Other Topics Concern  . Not on file  Social History Narrative  . Not on file   Social Determinants of Health   Financial Resource Strain:   . Difficulty of Paying Living Expenses:   Food Insecurity:   . Worried About RCharity fundraiserin the Last Year:   . RArboriculturistin  the Last Year:   Transportation Needs:   . LFilm/video editor(Medical):   .Marland KitchenLack of Transportation (Non-Medical):   Physical Activity:   . Days of Exercise per Week:   . Minutes of Exercise per Session:   Stress:   . Feeling of Stress :   Social Connections:   . Frequency of Communication with Friends and Family:   . Frequency of Social Gatherings with Friends and Family:   . Attends Religious Services:   . Active Member of Clubs or Organizations:   . Attends CArchivistMeetings:   .Marland KitchenMarital Status:     Family History: Family History  Problem Relation Age of Onset  . Hyperlipidemia Father   . Hypertension Father   . Varicose Veins Maternal Grandmother   . Varicose Veins Paternal Grandmother   . Diabetes Paternal Grandmother   . Hyperlipidemia Paternal Grandfather     Allergies: Allergies  Allergen Reactions  . Ambien [Zolpidem Tartrate] Other (See Comments)    Hallucination     Medications Prior to Admission  Medication Sig Dispense Refill Last Dose  .  ferrous sulfate (FERROUSUL) 325 (65 FE) MG tablet Take 1 tablet (325 mg total) by mouth 2 (two) times daily. 60 tablet 1 05/25/2019 at Unknown time  . Prenatal Vit-Fe Fumarate-FA (MULTIVITAMIN-PRENATAL) 27-0.8 MG TABS tablet Take 1 tablet by mouth daily at 12 noon.   05/25/2019 at Unknown time  . Blood Pressure Monitor KIT 1 kit by Does not apply route once a week. CHECK BP WEEKLY. LARGE CUFF  DX: O90.0 1 kit 0   . calcium gluconate 500 MG tablet Take 1 tablet by mouth daily.     Marland Kitchen doxylamine, Sleep, (UNISOM) 25 MG tablet Take 25 mg by mouth at bedtime as needed.     . metoCLOPramide (REGLAN) 10 MG tablet Take 1 tablet (10 mg total) by mouth 4 (four) times daily -  before meals and at bedtime. 60 tablet 1   . Multiple Vitamin (MULTIVITAMIN WITH MINERALS) TABS tablet Take 1 tablet by mouth daily.     Marland Kitchen omega-3 acid ethyl esters (LOVAZA) 1 g capsule Take by mouth daily.        Review of Systems  All  systems reviewed and negative except as stated in HPI  Physical Exam Height 5' 5"  (1.651 m), weight 82.4 kg, last menstrual period 08/18/2018. General appearance: alert, cooperative and no distress Lungs: clear to auscultation bilaterally Heart: regular rate and rhythm Abdomen: soft, non-tender; bowel sounds normal Extremities: No calf swelling or tenderness Presentation: cephalic Fetal monitoring: 130/moderate/+accels/ no decelerations  Uterine activity: irregular mild contractions  Dilation: 5 Effacement (%): 90 Station: -2 Exam by:: Wende Bushy CNM  Prenatal labs: ABO, Rh: O/RH(D) POSITIVE/-- (09/29 0956) Antibody: NO ANTIBODIES DETECTED (09/29 0956) Rubella: 1.88 (09/29 0956) RPR: NON-REACTIVE (02/02 0836)  HBsAg: NON-REACTIVE (09/29 0956)  HIV: NON-REACTIVE (02/02 0836)  GC/Chlamydia: Negative GBS:   Negative 2 hr Glucola: normal Genetic screening:  Normal  Anatomy US: normal female   Engineer, building services  [place] Dating   LMP  Language   ENGLISH Anatomy US   f/u in 4 weeks   Flu Vaccine   Genetic Screen  NIPS: Low Risk   AFP:       TDaP vaccine   03/03/19 Hgb A1C or  GTT  Third trimester Glucose, Fasting, Gest 65 - 91 mg/dL 77   Glucose, 1 Hr, Gest 65 - 179 mg/dL 165   Glucose, 2 Hr, Gest 65 - 152 mg/dL 146       Rhogam   O positive    LAB RESULTS   Feeding Plan  Breastfeed Blood Type O/RH(D) POSITIVE/-- (09/29 3810)   Contraception  None/vasectomy Antibody NO ANTIBODIES DETECTED (09/29 0956)  Circumcision yes if boy- Needs to know if private insurance covers  Rubella 1.88 (09/29 0956)  Pediatrician   [facility] Peds RPR NON-REACTIVE (09/29 0956)   Support Person  Husband HBsAg NON-REACTIVE (09/29 1751)   Prenatal Classes  Maybe HIV NON-REACTIVE (09/29 0956)  BTL Consent  GBS  (For PCN allergy, check sensitivities)   VBAC Consent  Pap  04/2017- normal     Hgb Electro  Negative  BP Cuff  Ordered 09/22/2018 CF Negative     SMA  2    Waterbirth  [  ] Class [ ]  Consent [ ]  CNM visit    Prenatal Transfer Tool  Maternal Diabetes: No Genetic Screening: Normal Maternal Ultrasounds/Referrals: Normal Fetal Ultrasounds or other Referrals:  None Maternal Substance Abuse:  No Significant Maternal Medications:  None Significant Maternal Lab Results: Group B Strep  negative  No results found for this or any previous visit (from the past 24 hour(s)).  Patient Active Problem List   Diagnosis Date Noted  . Decreased fetal movement affecting management of mother, antepartum 05/25/2019  . Encounter for induction of labor 05/25/2019  . Anemia of mother in pregnancy 03/05/2019  . Choroid plexus cyst of fetus on prenatal ultrasound 12/31/2018  . Supervision of other normal pregnancy, antepartum 09/22/2018  . Low grade squamous intraepith lesion on cytologic smear cervix (lgsil) 11/07/2015    Assessment: Jillian Holden is a 34 y.o. G2P1001 at 60w0dhere for IOL for DFM   #Labor: AROM performed at 1222. Discussed with patient induction process and can initiate pitocin in 2 hours if no cervical change from AROM, patient agreeable to plan of care  #Pain: Pain plans natural labor and delivery  #FWB: Cat I  #ID:  GBS neg  #MOF: Breast  #MOC: Vasectomy  #Circ:  Yes, inpatient   RLajean Manes CNM 05/25/2019, 12:50 PM

## 2019-05-25 NOTE — Progress Notes (Signed)
LABOR PROGRESS NOTE  Jillian Holden is a 34 y.o. G2P1001 at [redacted]w[redacted]d  admitted for IOL for DFM  Subjective: Patient reports contractions are stronger after AROM, breathing through contractions and coping well   Objective: BP 120/67   Pulse 89   Temp 98.2 F (36.8 C) (Oral)   Resp 16   Ht 5\' 5"  (1.651 m)   Wt 82.4 kg   LMP 08/18/2018 (Exact Date)   BMI 30.22 kg/m  or  Vitals:   05/25/19 1205 05/25/19 1241 05/25/19 1330 05/25/19 1538  BP:  117/63 115/65 120/67  Pulse:  95 82 89  Resp:  18 16   Temp:    98.2 F (36.8 C)  TempSrc:    Oral  Weight: 82.4 kg     Height: 5\' 5"  (1.651 m)       Dilation: 5 Effacement (%): 90 Station: -1, -2 Presentation: Vertex Exam by:: RN FHT: baseline rate 130, moderate varibility, +accel, no decel Toco: 2-3/ moderate by palpation   Labs: Lab Results  Component Value Date   WBC 9.3 05/25/2019   HGB 12.7 05/25/2019   HCT 38.3 05/25/2019   MCV 96.7 05/25/2019   PLT 205 05/25/2019    Patient Active Problem List   Diagnosis Date Noted  . Decreased fetal movement affecting management of mother, antepartum 05/25/2019  . Encounter for induction of labor 05/25/2019  . Anemia of mother in pregnancy 03/05/2019  . Choroid plexus cyst of fetus on prenatal ultrasound 12/31/2018  . Supervision of other normal pregnancy, antepartum 09/22/2018  . Low grade squamous intraepith lesion on cytologic smear cervix (lgsil) 11/07/2015    Assessment / Plan: 34 y.o. G2P1001 at [redacted]w[redacted]d here for IOL for DFM   Labor: No cervical change since AROM, discussed with patient initiation of pitocin. Patient and husband agreeable to plan of care. Pitocin started at 1538 Fetal Wellbeing:  Cat I  Pain Control:  Natural labor and delivery  Anticipated MOD:  SVD  34, CNM 05/25/2019, 3:50 PM

## 2019-05-26 ENCOUNTER — Encounter: Payer: BC Managed Care – PPO | Admitting: Certified Nurse Midwife

## 2019-05-26 LAB — CBC
HCT: 36 % (ref 36.0–46.0)
Hemoglobin: 11.9 g/dL — ABNORMAL LOW (ref 12.0–15.0)
MCH: 31.6 pg (ref 26.0–34.0)
MCHC: 33.1 g/dL (ref 30.0–36.0)
MCV: 95.5 fL (ref 80.0–100.0)
Platelets: 174 10*3/uL (ref 150–400)
RBC: 3.77 MIL/uL — ABNORMAL LOW (ref 3.87–5.11)
RDW: 15.9 % — ABNORMAL HIGH (ref 11.5–15.5)
WBC: 15.3 10*3/uL — ABNORMAL HIGH (ref 4.0–10.5)
nRBC: 0 % (ref 0.0–0.2)

## 2019-05-26 LAB — RPR: RPR Ser Ql: NONREACTIVE

## 2019-05-26 NOTE — Progress Notes (Addendum)
POSTPARTUM PROGRESS NOTE  Subjective: Jillian Holden is a 34 y.o. [id number] s/p NSVD at [redacted]w[redacted]d.  She reports she doing well. No acute events overnight. She denies any problems with ambulating, voiding or po intake. Denies nausea or vomiting. She has passed flatus. Pain is well controlled.  Lochia is normal.  Objective: Blood pressure (!) 107/58, pulse 85, temperature 98.1 F (36.7 C), temperature source Oral, resp. rate 16, height 5\' 5"  (1.651 m), weight 82.4 kg, last menstrual period 08/18/2018, SpO2 95 %, unknown if currently breastfeeding.  Physical Exam:  General: alert, cooperative and no distress Chest: no respiratory distress Abdomen: soft, non-tender  Uterine Fundus: firm, appropriately tender Extremities: No calf swelling or tenderness  No edema  Recent Labs    05/25/19 1250 05/26/19 0447  HGB 12.7 11.9*  HCT 38.3 36.0    Assessment/Plan: Jillian Holden is a 34 y.o. 34 s/p SVD at [redacted]w[redacted]d for IOL for decreased fetal movement.  Routine Postpartum Care: Doing well, pain well-controlled.  -- Continue routine care, lactation support  -- Contraception: Vasectomy -- Feeding: Breast  Dispo: Plan for discharge 4/28.  5/28, DO, PGY1  I saw and evaluated the patient. I agree with the findings and the plan of care as documented in the resident's note.  Jillian Pea, MD [facility] Family Medicine Fellow, [facility] for [facility]

## 2019-05-26 NOTE — Clinical Social Work Maternal (Signed)
CLINICAL SOCIAL WORK MATERNAL/CHILD NOTE  Patient Details  Name: Jillian Holden MRN: [id number] Date of Birth: 09/25/1985  Date:  14-Jun-2019  Clinical Social Worker Initiating Note:  Hortencia Pilar, LCSW Date/Time: Initiated:  05/26/19/0845     Child's Name:  Jillian Holden   Biological Parents:  Mother, Father(Jillian Holden, and Jillian Holden)   Need for Interpreter:  None   Reason for Referral:  Behavioral Health Concerns   Address:  [address] [place] [address]    Phone number:  [phone] (home)     Additional phone number: none   Household Members/Support Persons (HM/SP):   Household Member/Support Person 2   HM/SP Name Relationship DOB or Age  HM/SP -1   Jillian Holden  FOB     HM/SP -2 Jillian Holden MOB   HM/SP -3  Jillian Holden   daughter   32 years old   HM/SP -4        HM/SP -5        HM/SP -6        HM/SP -7        HM/SP -8          Natural Supports (not living in the home):  Parent, Friends, Teacher, music Supports: None   Employment: Environmental education officer   Type of Work: Insurance account manager   Education:  Engineer, maintenance (IT)   Homebound arranged:  n/a  Surveyor, quantity Resources:  Media planner   Other Resources:    none   Cultural/Religious Considerations Which May Impact Care:  none reported.   Strengths:  Ability to meet basic needs , Compliance with medical plan , Home prepared for child , Pediatrician chosen   Psychotropic Medications:       None currently.   Pediatrician:    [facility] (including [place])  Pediatrician List:   [facility] Other([facility])    Pediatrician Fax Number:    Risk Factors/Current Problems:  None   Cognitive State:  Able to Concentrate , Alert , Insightful    Mood/Affect:  Calm , Interested , Happy , Relaxed , Comfortable    CSW Assessment: CSW consulted as MOB has  a hx of mental health diagnosis. CSW went to speak with MOB at bedside to address further needs.   CSW congratulated MOB and FOB on the birth of infant. CSW advised MOB of the HIPPA policy and MOB was agreeable to FOB remaining in the room. CSW understanding and advised MOB of CSWs' role and the reason for CSW coming to visit with her. MOB reported that she was diagnosed with depression and anxiety in college. MOB reported that she was on medications at that time however is no longer on medications as she doesn't need them. MOB reported that in 2014 she was diagnose with PTSD and was placed into therapy. MOB reported that therapy was very helpful for her but no longer desires therapy at this time. MOB reported that her depression diagnoses was actually a misdiagnosis and she was then diagnosed with Bipolar II. MOB reported that she was on medications for this as well, but no longer needing Trazodone. MOB reported that her mental health has been well self managed by eating healthy and working out. MOB denies SI and HI at this time.    MOB reported that she has support from her spouse as well as other family members  and friends. MOB reported that she has all needed items to care for infant with no other needs.   CSW provided MOB with PPD and SODS education. MOB was given PPD Checklist in order to keep track of feelings as they relate to PPD. MOB reported that she has no other questions or needs at this time.   CSW Plan/Description:  No Further Intervention Required/No Barriers to Discharge, Sudden Infant Death Syndrome (SIDS) Education, Perinatal Mood and Anxiety Disorder (PMADs) Education    Diamonds Lippard S Dede Dobesh, LCSWA 05/26/2019, 9:07 AM 

## 2019-05-26 NOTE — Lactation Note (Signed)
This note was copied from a baby's chart. Lactation Consultation Note  Patient Name: Boy Myron Lona Today's Date: 05/26/2019   Upon entering the room, MOB and FOB were standing around the bed. [facility] student greeted the family and checked in. Baby was out of the room getting a circumcision. MOB said that everything was going well so far. [facility] student did explain that after a circumcision babies are normally sleepy but milk can be expressed and finger fed to entice baby to feed. MOB stated that they attempted a feed at the breast before the circumcision but baby was too sleepy. MOB did express and spoon feed a few drops before baby left. She said it had been about 4-5 hours since last feeding at breast so when baby got back they would try STS and if they felt like baby was hungry that would hand express and feed expressed milk to baby. [facility] student encouraged the family to call out if they need help with the feeding after circumcision. MOB and FOB stated that feedings are going well and baby has met the daily expectation in output.  [facility] student went over cluster feeding, cue-based feeding, expected output, 8-12x in 24 hours, resources on the pamphlet, and how baby may be after circumcision.  Parents have no questions or concerns. Willing to call out if they need assistance later.   Lysle Rubens [place]con 05/26/2019, 11:05 AM

## 2019-05-27 NOTE — Lactation Note (Signed)
This note was copied from a baby's chart. Lactation Consultation Note  Patient Name: Jillian Holden IAXKP'V Date: 05/27/2019 Reason for consult: Follow-up assessment   P2, Ex BF.  Baby 37 hours old and latched upon entering w/ intermittent swallows. Mother denies questions or concerns. Feed on demand with cues. Reviewed engorgement care and monitoring voids/stools. Discussed pumping and storing milk for work.    Maternal Data    Feeding Feeding Type: Breast Fed  LATCH Score Latch: Grasps breast easily, tongue down, lips flanged, rhythmical sucking.  Audible Swallowing: A few with stimulation  Type of Nipple: Everted at rest and after stimulation  Comfort (Breast/Nipple): Soft / non-tender  Hold (Positioning): No assistance needed to correctly position infant at breast.  LATCH Score: 9  Interventions Interventions: Breast feeding basics reviewed  Lactation Tools Discussed/Used     Consult Status Consult Status: Complete Date: 05/27/19    Dahlia Byes [facility] 05/27/2019, 9:23 AM

## 2019-06-02 ENCOUNTER — Other Ambulatory Visit: Payer: Self-pay | Admitting: Certified Nurse Midwife

## 2019-06-02 DIAGNOSIS — O99013 Anemia complicating pregnancy, third trimester: Secondary | ICD-10-CM

## 2019-06-15 ENCOUNTER — Telehealth: Payer: Self-pay | Admitting: *Deleted

## 2019-06-15 MED ORDER — SULFAMETHOXAZOLE-TRIMETHOPRIM 800-160 MG PO TABS
1.0000 | ORAL_TABLET | Freq: Two times a day (BID) | ORAL | 0 refills | Status: DC
Start: 2019-06-15 — End: 2019-06-30

## 2019-06-15 NOTE — Telephone Encounter (Signed)
Pt called with complaints of Rt lateral breast pain, fever of 101 ,HA and chills x 24 hrs.  She states that her milk supply has also decreased on the right.  Per Dr Maylene Roes for Bactrim DS was sent to Pharmacy for BID x 10 days.  Pt is to pump and dump but continue to massage the breast and apply warm heat to area.  If not any improvement in 48 hrs she know she would need an appt.

## 2019-06-26 NOTE — Progress Notes (Signed)
error 

## 2019-06-30 ENCOUNTER — Telehealth (INDEPENDENT_AMBULATORY_CARE_PROVIDER_SITE_OTHER): Payer: BC Managed Care – PPO | Admitting: Advanced Practice Midwife

## 2019-06-30 ENCOUNTER — Encounter: Payer: Self-pay | Admitting: Advanced Practice Midwife

## 2019-06-30 DIAGNOSIS — O9122 Nonpurulent mastitis associated with the puerperium: Secondary | ICD-10-CM

## 2019-06-30 NOTE — Progress Notes (Signed)
I connected with@ on 06/30/19 at  8:15 AM EDT by: Mychart video and verified that I am speaking with the correct person using two identifiers.  Patient is located at home and provider is located at [employer] for [employer] at [place] .     The purpose of this virtual visit is to provide medical care while limiting exposure to the novel coronavirus. I discussed the limitations, risks, security and privacy concerns of performing an evaluation and management service by MyChart and the availability of in person appointments. I also discussed with the patient that there may be a patient responsible charge related to this service. By engaging in this virtual visit, you consent to the provision of healthcare.  Additionally, you authorize for your insurance to be billed for the services provided during this visit.  The patient expressed understanding and agreed to proceed.  The following staff members participated in the virtual visit:  Fatima Blank, CNM  Post Partum Visit Note Subjective:   Jillian Holden is a 34 y.o. [phone] female being evaluated for postpartum followup.  She is 5 weeks postpartum following a IOL vaginal delivery at  40 gestational weeks.  I have fully reviewed the prenatal and intrapartum course; pregnancy complicated by n/a.  Postpartum course has been unremarkable other than being treated for Mastitis on 06/15/19. Baby is doing well. Baby is feeding by breast. Bleeding no bleeding. Bowel function is normal. Bladder function is normal. Patient is not sexually active. Contraception method is vasectomy. Postpartum depression screening: negative.  The following portions of the patient's history were reviewed and updated as appropriate: allergies, current medications, past family history, past medical history, past social history, past surgical history and problem list.  Review of Systems Pertinent items noted in HPI and remainder of comprehensive ROS otherwise  negative.   Objective:  There were no vitals filed for this visit. Self-Obtained       Assessment:   1. Postpartum examination following vaginal delivery --Doing well.  Bonding with baby. Exclusively pumping currently.  Reports good support at home.  2. Mastitis, obstetric, postpartum condition --Tx with abx on 06/15/19, symptoms improved in 24 hours, no further concerns.    Plan:  Essential components of care per ACOG recommendations:  1.  Mood and well being: Patient with negative depression screening today. Reviewed local resources for support.  - Patient does not use tobacco.  - hx of drug use? No    2. Infant care and feeding:  -Patient currently breastmilk feeding? Yes  --Pt exclusively pumping, oversupply at this time.  Reports poor latch from infant, incomplete draining of breasts.  Discussed options, including continuing to exclusively pump and making appt with lactation to work on latch and resume some latched breastfeeding.  Pt considering options.   -Social determinants of health (SDOH) reviewed in EPIC. No concerns.    3. Sexuality, contraception and birth spacing - Patient does not want a pregnancy in the next year.  Desired family size is 2 children.  - Reviewed forms of contraception in tiered fashion. Patient desired vasectomy today.   - Discussed birth spacing of 18 months  4. Sleep and fatigue -Encouraged family/partner/community support of 4 hrs of uninterrupted sleep to help with mood and fatigue  5. Physical Recovery  - Discussed patients delivery - Patient had a 1st degree laceration, perineal healing reviewed. Patient expressed understanding - Patient has urinary incontinence? No  - Patient is safe to resume physical and sexual activity  6.  Health Maintenance - Last pap smear done 2019 and was normal with negative HPV.  10 minutes of non-face-to-face time spent with the patient    Sharen Counter, CNM [facility] [employer], [facility]

## 2019-10-16 IMAGING — US US PELVIS COMPLETE TRANSABD/TRANSVAG
1 series · 14 of 25 positions shown · non-contrast
Comparison: None

CLINICAL DATA: Irregular periods



[Series 1: us pelvis complete transabd/transvag · 0.18mm/px · 14 of 91 slices shown]
[im 1/91]
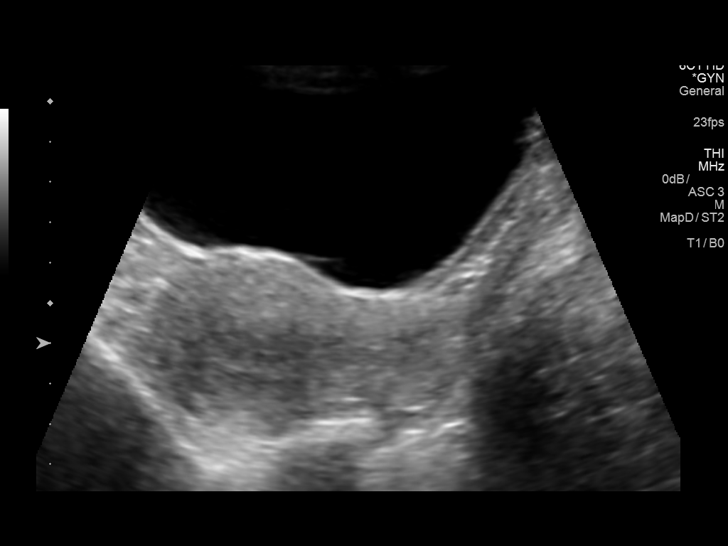
[im 8/91]
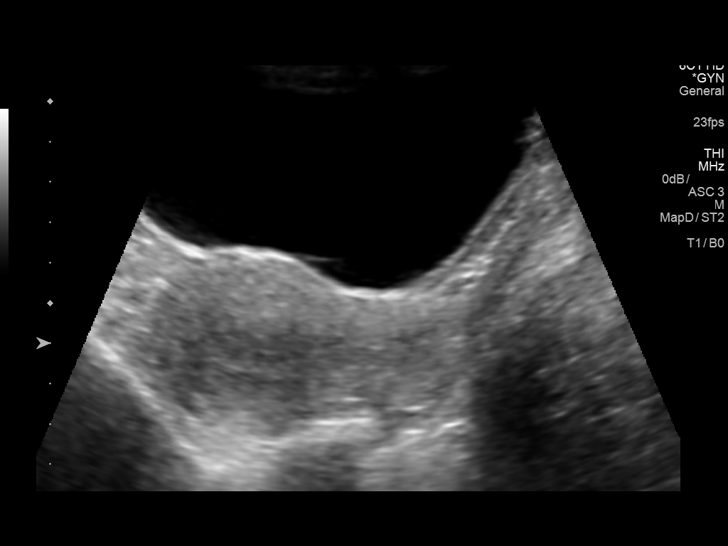
[im 16/91]
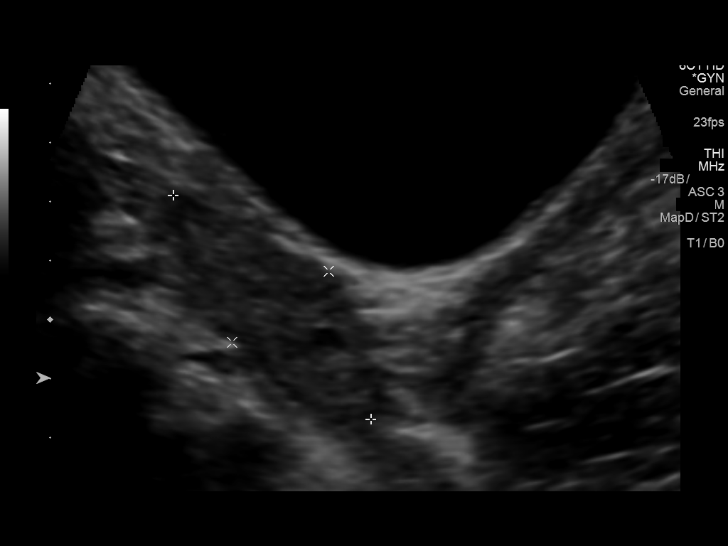
[im 23/91]
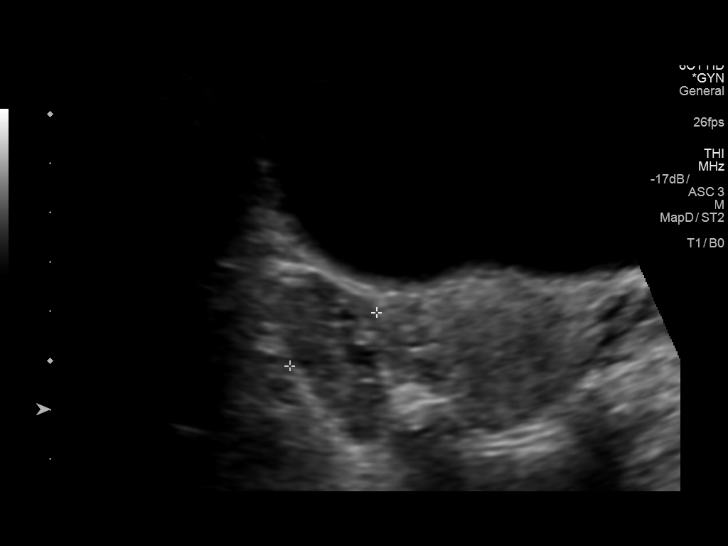
[im 31/91]
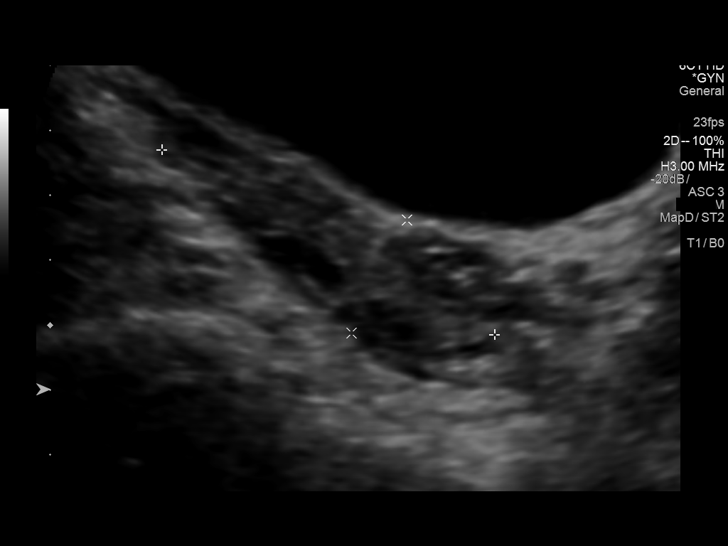
[im 34/91]
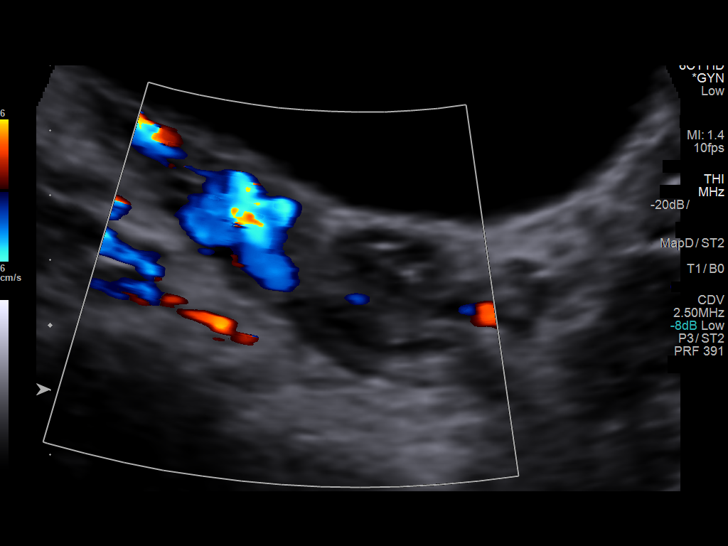
[im 42/91]
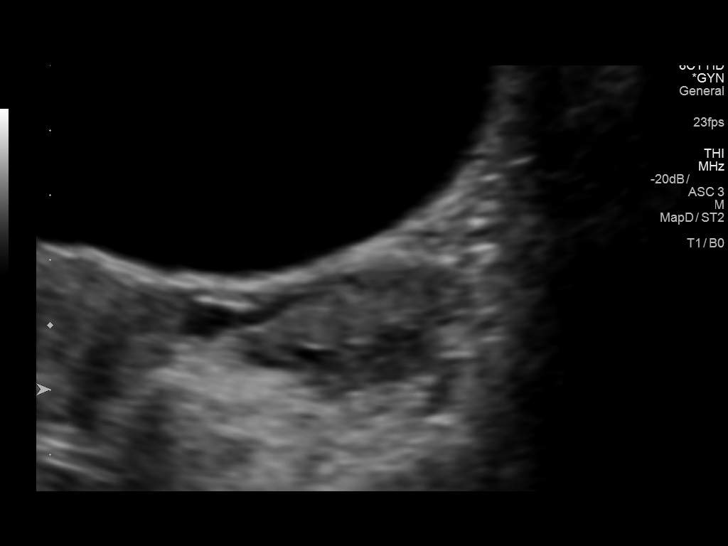
[im 49/91]
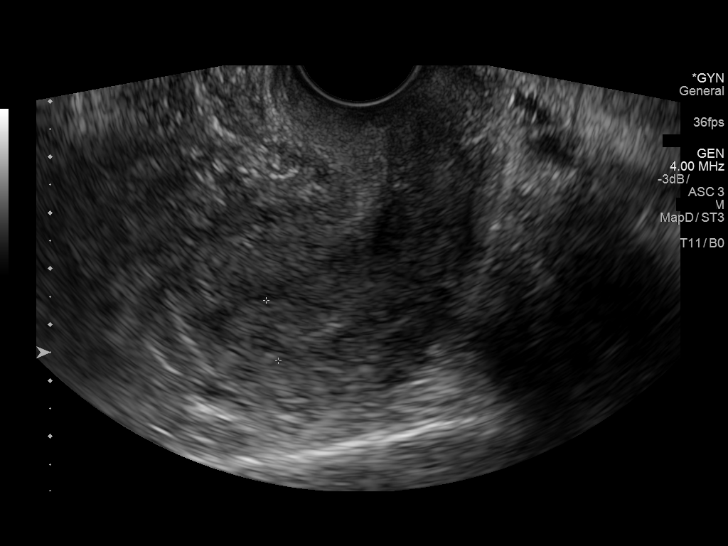
[im 57/91]
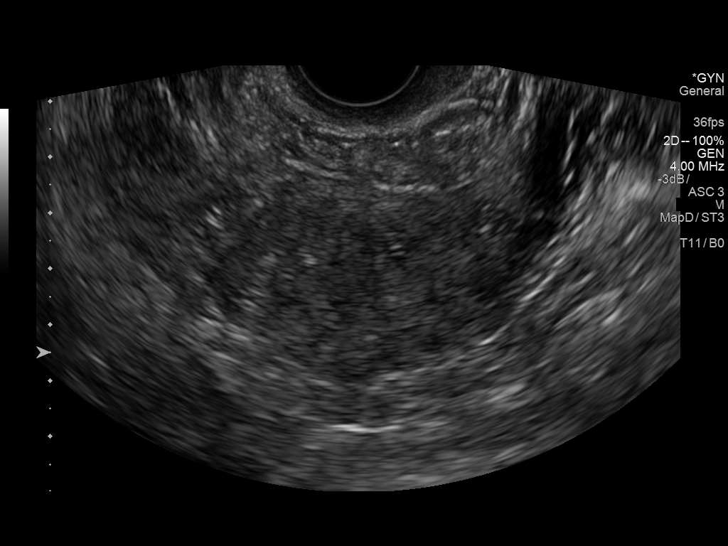
[im 61/91]
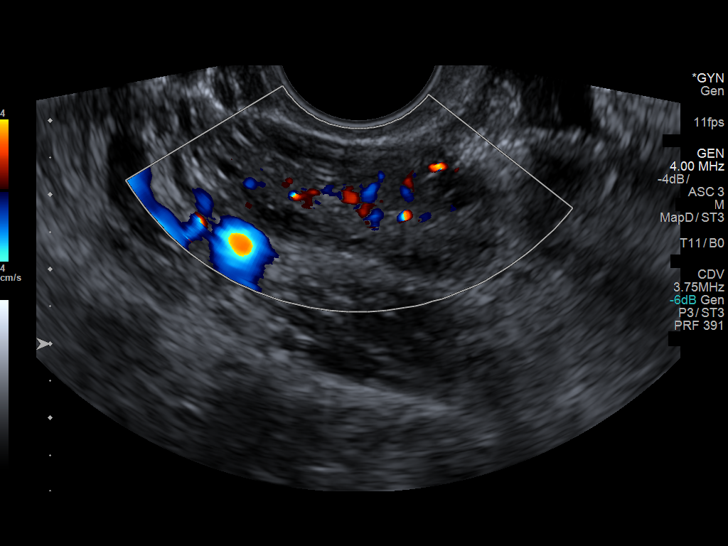
[im 68/91]
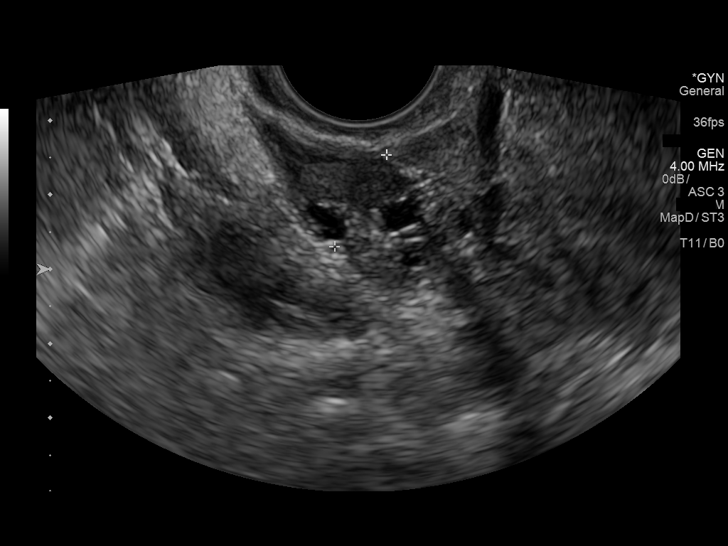
[im 76/91]
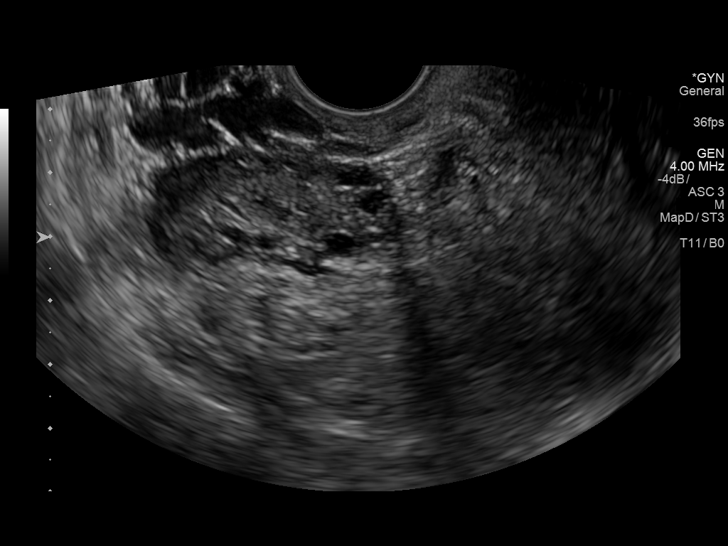
[im 83/91]
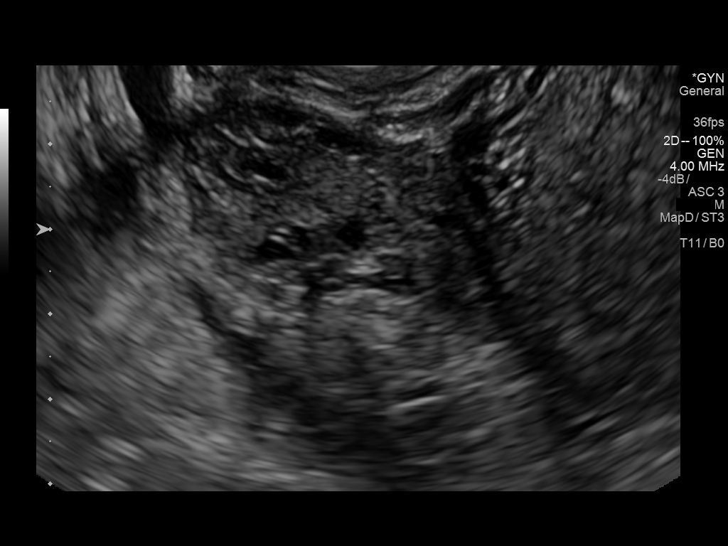
[im 91/91]
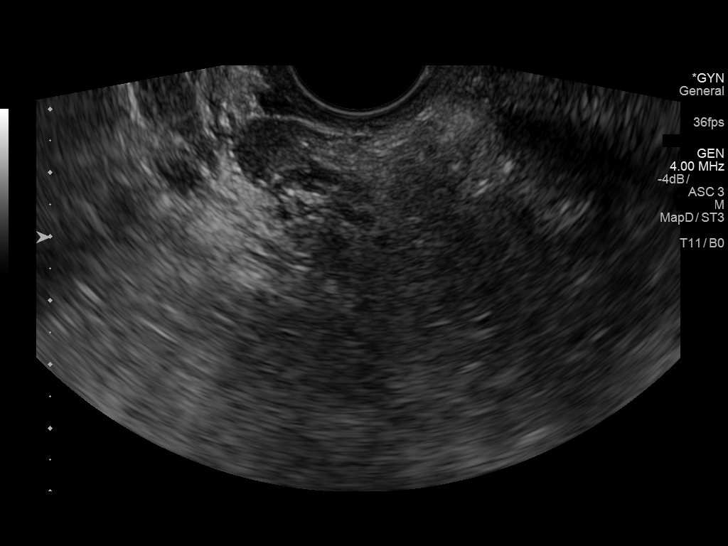

[14 of 25 positions shown; findings below may reference images not displayed]

FINDINGS: Uterus

Measurements: 8 x 4.5 x 5.6 cm. No fibroids or other mass
visualized.

Endometrium

Thickness: 11 mm.  No focal abnormality visualized.

Right ovary

Measurements: 4.4 x 1.7 x 1.4 cm. Normal appearance/no adnexal mass.

Left ovary

Measurements: 3.8 x 1.8 x 1.8 cm. Normal appearance/no adnexal mass.

Other findings

No abnormal free fluid.
IMPRESSION: Negative pelvic ultrasound
